# Patient Record
Sex: Female | Born: 1975 | Race: White | Hispanic: No | Marital: Married | Smoking: Former smoker
Health system: Southern US, Community
[De-identification: ages and names within clinical notes are randomized; demographics above are authoritative.]

## PROBLEM LIST (undated history)

## (undated) DIAGNOSIS — F319 Bipolar disorder, unspecified: Secondary | ICD-10-CM

## (undated) DIAGNOSIS — F419 Anxiety disorder, unspecified: Secondary | ICD-10-CM

## (undated) DIAGNOSIS — F32A Depression, unspecified: Secondary | ICD-10-CM

## (undated) DIAGNOSIS — M797 Fibromyalgia: Secondary | ICD-10-CM

## (undated) DIAGNOSIS — F4310 Post-traumatic stress disorder, unspecified: Secondary | ICD-10-CM

## (undated) DIAGNOSIS — E119 Type 2 diabetes mellitus without complications: Secondary | ICD-10-CM

## (undated) DIAGNOSIS — D649 Anemia, unspecified: Secondary | ICD-10-CM

## (undated) DIAGNOSIS — Z973 Presence of spectacles and contact lenses: Secondary | ICD-10-CM

## (undated) DIAGNOSIS — R7303 Prediabetes: Secondary | ICD-10-CM

## (undated) DIAGNOSIS — E66813 Obesity, class 3: Secondary | ICD-10-CM

## (undated) DIAGNOSIS — M1990 Unspecified osteoarthritis, unspecified site: Secondary | ICD-10-CM

## (undated) DIAGNOSIS — F329 Major depressive disorder, single episode, unspecified: Secondary | ICD-10-CM

## (undated) DIAGNOSIS — Z9289 Personal history of other medical treatment: Secondary | ICD-10-CM

## (undated) HISTORY — DX: Morbid (severe) obesity due to excess calories: E66.01

## (undated) HISTORY — DX: Depression, unspecified: F32.A

## (undated) HISTORY — DX: Anxiety disorder, unspecified: F41.9

## (undated) HISTORY — DX: Post-traumatic stress disorder, unspecified: F43.10

## (undated) HISTORY — DX: Fibromyalgia: M79.7

## (undated) HISTORY — DX: Bipolar disorder, unspecified: F31.9

## (undated) HISTORY — DX: Type 2 diabetes mellitus without complications: E11.9

## (undated) HISTORY — DX: Obesity, class 3: E66.813

## (undated) HISTORY — DX: Anemia, unspecified: D64.9

## (undated) HISTORY — DX: Prediabetes: R73.03

## (undated) HISTORY — DX: Major depressive disorder, single episode, unspecified: F32.9

---

## 2004-07-27 HISTORY — PX: CHOLECYSTECTOMY: SHX55

## 2004-12-12 ENCOUNTER — Observation Stay (HOSPITAL_COMMUNITY): Admission: EM | Admit: 2004-12-12 | Discharge: 2004-12-13 | Payer: Self-pay | Admitting: General Surgery

## 2005-11-01 IMAGING — RF DG CHOLANGIOGRAM OPERATIVE
1 series · 4 of 4 positions shown · non-contrast
Comparison: none

CLINICAL DATA: Cholelithiasis. 
 INTRAOPERATIVE CHOLANGIOGRAM:
 The common bile duct is normal in caliber.  There are no filling defects or strictures and there is spillage of contrast into the duodenum.  The intrahepatic biliary radicles have a normal appearance.

[Series 1: run · 4 of 65 frames shown]
[frame 10/65]
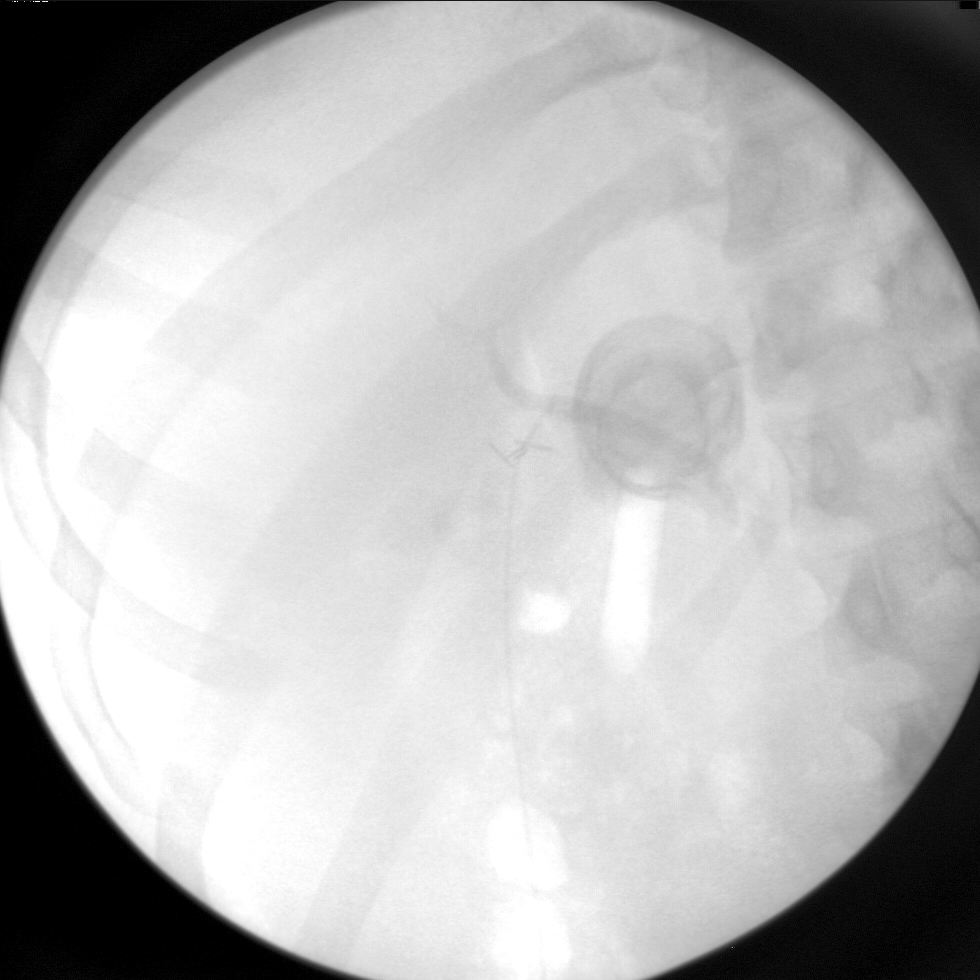
[frame 33/65]
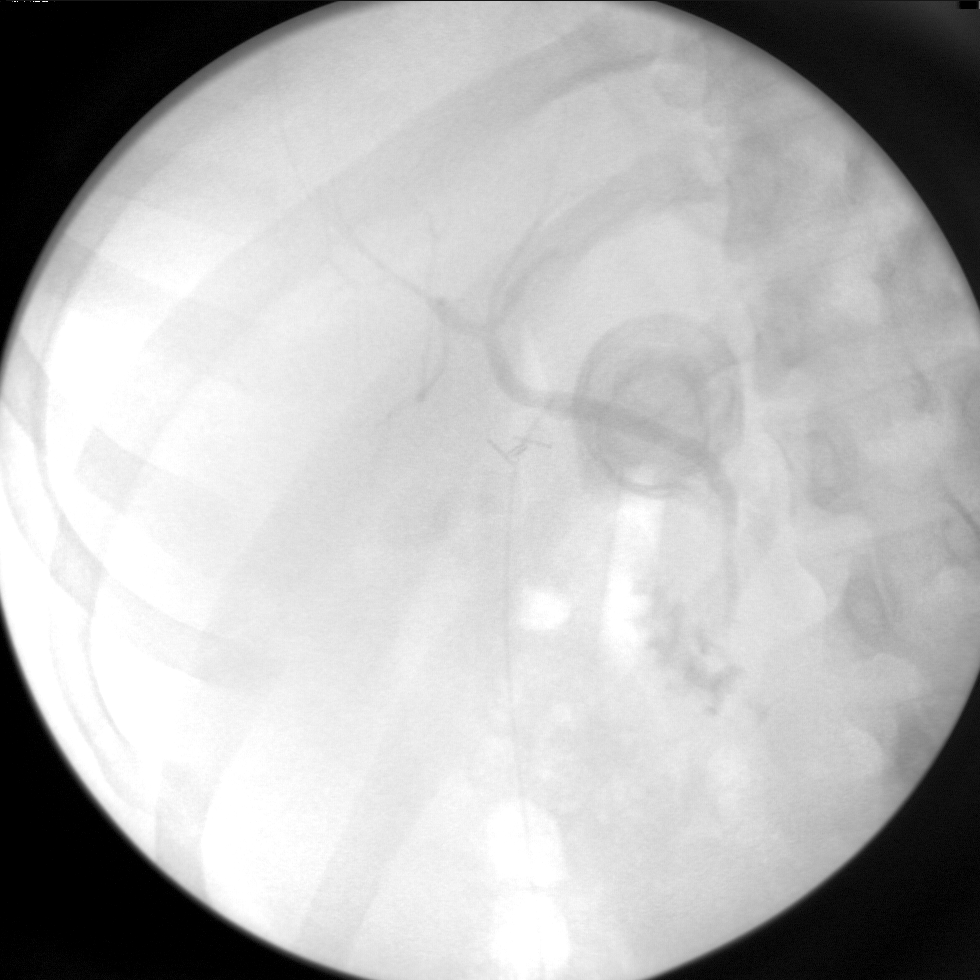
[frame 56/65]
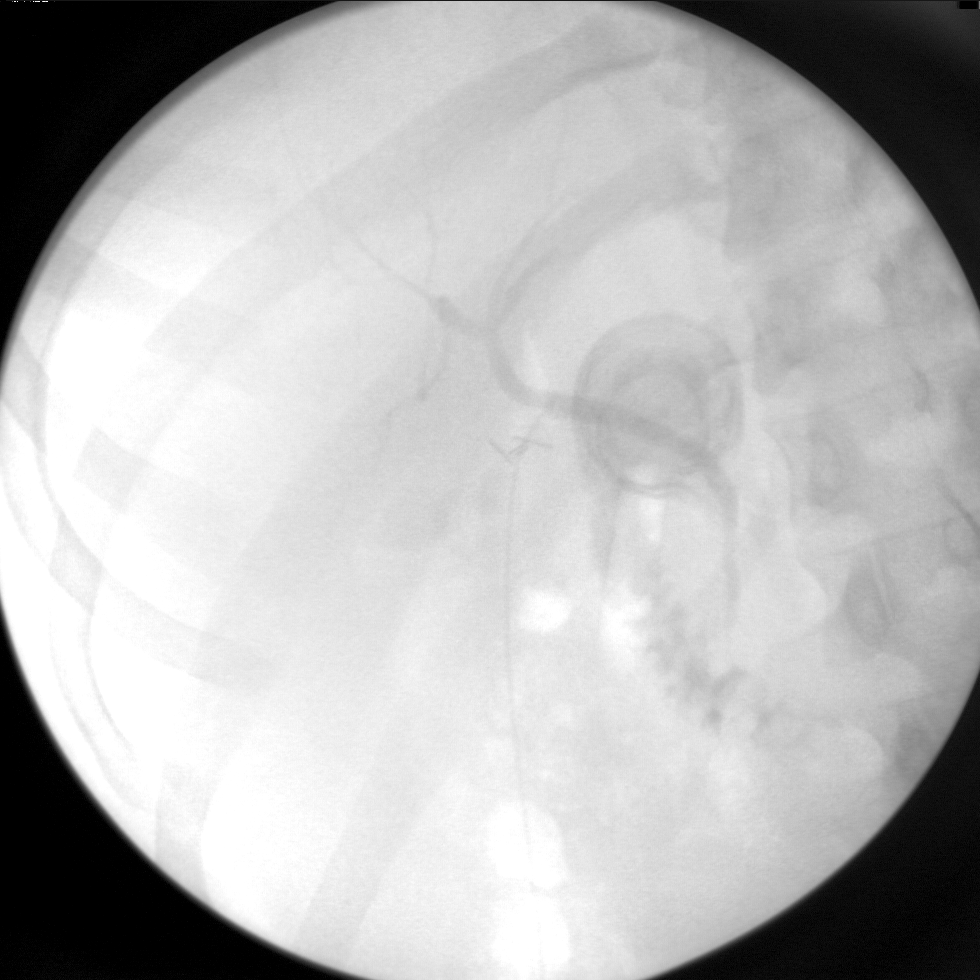
[frame 62/65]
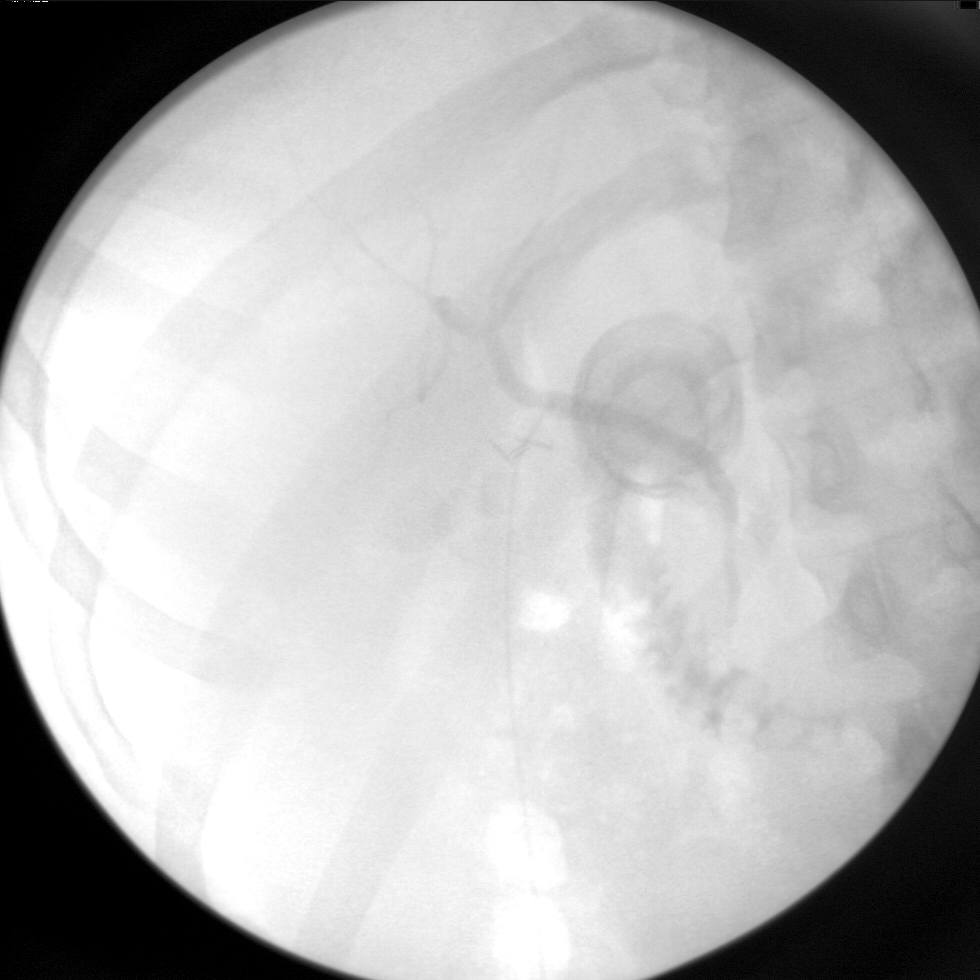

[4 of 4 positions shown; findings below may reference images not displayed]

IMPRESSION: Normal study.

## 2016-03-03 ENCOUNTER — Other Ambulatory Visit: Payer: Self-pay | Admitting: Orthopedic Surgery

## 2018-04-19 ENCOUNTER — Encounter: Payer: Self-pay | Admitting: Neurology

## 2018-04-21 ENCOUNTER — Ambulatory Visit (INDEPENDENT_AMBULATORY_CARE_PROVIDER_SITE_OTHER): Payer: Medicare Other | Admitting: Neurology

## 2018-04-21 ENCOUNTER — Encounter: Payer: Self-pay | Admitting: Neurology

## 2018-04-21 VITALS — BP 152/86 | HR 93 | Ht 65.0 in | Wt 325.0 lb

## 2018-04-21 DIAGNOSIS — G4730 Sleep apnea, unspecified: Secondary | ICD-10-CM

## 2018-04-21 DIAGNOSIS — G4711 Idiopathic hypersomnia with long sleep time: Secondary | ICD-10-CM | POA: Diagnosis not present

## 2018-04-21 DIAGNOSIS — R0683 Snoring: Secondary | ICD-10-CM | POA: Insufficient documentation

## 2018-04-21 DIAGNOSIS — R5383 Other fatigue: Secondary | ICD-10-CM

## 2018-04-21 DIAGNOSIS — F4310 Post-traumatic stress disorder, unspecified: Secondary | ICD-10-CM | POA: Insufficient documentation

## 2018-04-21 DIAGNOSIS — F329 Major depressive disorder, single episode, unspecified: Secondary | ICD-10-CM

## 2018-04-21 DIAGNOSIS — G4710 Hypersomnia, unspecified: Secondary | ICD-10-CM | POA: Diagnosis not present

## 2018-04-21 DIAGNOSIS — F32A Depression, unspecified: Secondary | ICD-10-CM

## 2018-04-21 NOTE — Progress Notes (Signed)
SLEEP MEDICINE CLINIC   Provider:  Larey Seat, M.D.  Primary Care Physician:  Everardo Beals, NP Referring Provider: Garnetta Buddy, NP  - [facility].    Chief Complaint  Patient presents with  . New Patient (Initial Visit)    pt alone, rm 11. pt here to "have a sleep study completed to assess if she has sleep apnea"pt states she sleeps long and she is not rested when waking up. was recently told he had apnea and also snores. pt states that she had a sleep study over 10 years at  Dr Carren Rang lab on [facility] [address]. -she didnt achieve REM sleep but at the time no apnea was found.    HPI:  Megan Haas is a 42 y.o. female patient seen on 04-21-2018  in a referral from NP Cox for a new evaluation of sleep.   Chief complaint according to patient : "I sleep a lot and do not work up rested".   She snores and recently learnt that she pauses in her breathing while sharing her bedroom at a retreat. She has carried a diagnosis of Fibromyalgia since age 26 and became morbidly obese while treated for depression/ anxiety.   Sleep habits are as follows: no regular dinner time, anytime between 4 and 9 PM- work related. Sometimes she is getting  hungry only later in the evening.  Bedtime is not later than 11 PM, after watching TV or sawing. The bedroom is dark with light blocking windows, cool and quiet.  Usually, she is asleep fast, resting on on multiple pillows in supine position or on her right side. Has 2 times nocturia each night. She snores and  has recently learnt that she is apnoeic .  She can sleep up to 13 hours, averages 10 hours. She is not rested and drinks a lot of coffee. This has been going on for decades. She struggles not to fall asleep in daytime, but naps and is not planning to sleep.    Sleep medical history and family sleep history: father has sleep apnea.    Social history: Lives with a room mate, no pets. Non smoker, no drinker, caffeine  in form of coffee and tea several cups in AM. Sometimes in PM . Rarely drinks soda .  She is a delivery driver for a store.  No history of night shift work.   Review of Systems: Out of a complete 14 system review, the patient complains of only the following symptoms, and all other reviewed systems are negative.  Hypersomnia, long sleep time, no dreams intrusion.  On Vyvanse, Wellbutrin, Prazosin for PTSD - if not on Prazosin will have very vivid dreams, not flash backs. Marland Kitchen   Epworth score 14/ 24  , Fatigue severity score 60/63 points  , depression score N/A   Social History   Socioeconomic History  . Marital status: Single    Spouse name: Not on file  . Number of children: Not on file  . Years of education: Not on file  . Highest education level: Not on file  Occupational History  . Not on file  Social Needs  . Financial resource strain: Not on file  . Food insecurity:    Worry: Not on file    Inability: Not on file  . Transportation needs:    Medical: Not on file    Non-medical: Not on file  Tobacco Use  . Smoking status: Former Smoker    Last attempt  to quit: 2004    Years since quitting: 15.7  . Smokeless tobacco: Never Used  Substance and Sexual Activity  . Alcohol use: Not Currently    Comment: 1991 quit  . Drug use: Not Currently    Comment: quit 1991  . Sexual activity: Not on file  Lifestyle  . Physical activity:    Days per week: Not on file    Minutes per session: Not on file  . Stress: Not on file  Relationships  . Social connections:    Talks on phone: Not on file    Gets together: Not on file    Attends religious service: Not on file    Active member of club or organization: Not on file    Attends meetings of clubs or organizations: Not on file    Relationship status: Not on file  . Intimate partner violence:    Fear of current or ex partner: Not on file    Emotionally abused: Not on file    Physically abused: Not on file    Forced sexual activity:  Not on file  Other Topics Concern  . Not on file  Social History Narrative  . Not on file    Family History  Problem Relation Age of Onset  . Diabetes Father   . Hypertension Maternal Aunt   . Diabetes Maternal Uncle   . Hypertension Maternal Grandmother   . Diabetes Paternal Grandmother     Past Medical History:  Diagnosis Date  . Anxiety   . Depression   . Fibromyalgia   . Pre-diabetes      Current Outpatient Medications  Medication Sig Dispense Refill  . ALPRAZolam (XANAX) 0.5 MG tablet Take 0.5-1 mg by mouth daily.  1  . ARIPiprazole (ABILIFY) 10 MG tablet Take 10 mg by mouth daily.    Marland Kitchen buPROPion (WELLBUTRIN XL) 300 MG 24 hr tablet Take 300 mg by mouth daily.  1  . gabapentin (NEURONTIN) 400 MG capsule Take 400 mg by mouth 4 (four) times daily.    Marland Kitchen lisdexamfetamine (VYVANSE) 70 MG capsule Take 70 mg by mouth daily.    . prazosin (MINIPRESS) 2 MG capsule Take 2 mg by mouth at bedtime.    Marland Kitchen VIIBRYD 40 MG TABS Take 40 mg by mouth daily.  1   No current facility-administered medications for this visit.     Allergies as of 04/21/2018  . (Not on File)    Vitals: BP (!) 152/86   Pulse 93   Ht 5\' 5"  (1.651 m)   Wt (!) 325 lb (147.4 kg)   BMI 54.08 kg/m  Last Weight:  Wt Readings from Last 1 Encounters:  04/21/18 (!) 325 lb (147.4 kg)   YTK:ZSWF mass index is 54.08 kg/m.     Last Height:   Ht Readings from Last 1 Encounters:  04/21/18 5\' 5"  (1.651 m)    Physical exam:  General: The patient is awake, alert and appears not in acute distress. The patient is well groomed. Head: Normocephalic, atraumatic. Neck is supple. Mallampati 5,  neck circumference:19.5. Nasal airflow patent , Prognathia is seen.  Cardiovascular:  Regular rate and rhythm , without  murmurs or carotid bruit, and without distended neck veins. Respiratory: Lungs are clear to auscultation. Skin:  Without evidence of edema, or rash Trunk: BMI is 54.8. The patient's posture is stooped.    Neurologic exam : The patient is awake and alert, oriented to place and time.    Attention span & concentration  ability appears normal.  Speech is fluent,  without dysarthria, dysphonia or aphasia.  Mood and affect are appropriate.  Cranial nerves: Pupils are equal and briskly reactive to light. Funduscopic exam deferred -. Extraocular movements  in vertical and horizontal planes intact and without nystagmus. Visual fields by finger perimetry are intact. Hearing to finger rub intact.  Facial sensation intact to fine touch. Facial motor strength is symmetric and tongue and uvula move midline. Shoulder shrug was symmetrical.   Motor exam:   Normal tone, muscle bulk and symmetric strength in all extremities. Sensory:  Fine touch, pinprick and vibration were tested in all extremities. Proprioception tested in the upper extremities was normal. Coordination: Rapid alternating movements in the fingers/hands was normal. Finger-to-nose maneuver  normal without evidence of ataxia, dysmetria or tremor. Gait and station: Patient walks without assistive device and is able unassisted to climb up to the exam table. Strength within normal limits.  Stance is wider based - stable .   Turns with 3 Steps. Romberg testing is negative.  Deep tendon reflexes: in the  upper and lower extremities are symmetric and intact. Babinski maneuver response is  downgoing.  Reviewed [facility] visit notes by NP Cox.   Assessment:  After physical and neurologic examination, review of laboratory studies,  Personal review of imaging studies, reports of other /same  Imaging studies, results of polysomnography and / or neurophysiology testing and pre-existing records as far as provided in visit., my assessment is   1) I have the pleasure of meeting Megan Haas today, a 42 year old Caucasian left-handed female patient who is new to our practice. The patient has a almost lifelong history of hypersomnia, craving sleep, having  an irresistible urge to go to sleep and daytime but also reports vivid dreams at night.  She has been treated for PTSD and she is using an antidepressant, Wellbutrin, Abilify, she does have PRN use of the benzodiazepine for sleep initiation if needed or for anxiety, and she uses prazosin.  Problems and has reduced the nighttime vivid and lucid dreams.  The patient recalls that prior to her traumatic events that set off the PTSD she was not as daytime sleepy, she was not as often in pain or discomfort and she was more productive and focused during the day.  We will have to concentrate on organic sleep disorders and in her case she has several red flags for the presence of obstructive sleep apnea, she is prediabetic she has a body mass index that approaches 50, she does have hypertension here today in the office as well as in the previous office visit with nurse practitioner Cox.  She has been told that she snores and recently a roommate has mentioned that she has apnea.  But I think that obstructive sleep apnea is identified with be needed to be treated it may not be the only factor contributing to the hypersomnia.--there are non-organic factors there.   2) BMI- patient may benefit from medical weight management.      The patient was advised of the nature of the diagnosed disorder , the treatment options and the  risks for general health and wellness arising from not treating the condition.   I spent more than 50 minutes of face to face time with the patient.  Greater than 50% of time was spent in counseling and coordination of care. We have discussed the diagnosis and differential and I answered the patient's questions.    Plan:  Treatment plan and additional workup :  Attended sleep study SPLIT night,  High degree of fatigue and sleepiness.  PTSD. May benefit from expanded EEG montage.    Larey Seat, MD 03/26/7459, 02:98 PM  Certified in Neurology by ABPN Certified in [place] by  [facility] [address] [place],  [address]

## 2018-04-29 ENCOUNTER — Ambulatory Visit (INDEPENDENT_AMBULATORY_CARE_PROVIDER_SITE_OTHER): Payer: Medicare Other | Admitting: Neurology

## 2018-04-29 DIAGNOSIS — R5383 Other fatigue: Secondary | ICD-10-CM

## 2018-04-29 DIAGNOSIS — G4711 Idiopathic hypersomnia with long sleep time: Secondary | ICD-10-CM

## 2018-04-29 DIAGNOSIS — F4310 Post-traumatic stress disorder, unspecified: Secondary | ICD-10-CM

## 2018-04-29 DIAGNOSIS — G4710 Hypersomnia, unspecified: Secondary | ICD-10-CM

## 2018-04-29 DIAGNOSIS — F329 Major depressive disorder, single episode, unspecified: Secondary | ICD-10-CM

## 2018-04-29 DIAGNOSIS — G4730 Sleep apnea, unspecified: Secondary | ICD-10-CM

## 2018-04-29 DIAGNOSIS — R0683 Snoring: Secondary | ICD-10-CM

## 2018-05-03 NOTE — Procedures (Signed)
PATIENT'S NAME:  Megan Haas, Megan Haas DOB:      04-Jul-1976      MR#:    [id number]     DATE OF RECORDING: 04/29/2018 REFERRING M.D.:  Lorelle Gibbs, NP Study Performed:   Baseline Polysomnogram HISTORY:  Megan Haas is a 42 y.o. female patient with a main complaint: "I sleep a lot and do not wake up rested".   She was told that she snores and recently learnt that she pauses in her breathing while sharing her bedroom at a retreat. She has felt fatigued- excessively, and carried a diagnosis of Fibromyalgia since age 63 .She became morbidly obese while treated for depression/ anxiety.  The patient endorsed the Epworth Sleepiness Scale at 14/24 points, the FSS at 60/63 points.   The patient's weight 325 pounds with a height of 65 (inches), resulting in a BMI of 54.0 kg/m2. The patient's neck circumference measured 19.5 inches.  CURRENT MEDICATIONS: Xanax, Abilify, Wellbutrin, Neurontin, Vyvanse, Minipress, Viibryd   PROCEDURE:  This is a multichannel digital polysomnogram utilizing the Somnostar 11.2 system.  Electrodes and sensors were applied and monitored per AASM Specifications.   EEG, EOG, Chin and Limb EMG, were sampled at 200 Hz.  ECG, Snore and Nasal Pressure, Thermal Airflow, Respiratory Effort, CPAP Flow and Pressure, Oximetry was sampled at 50 Hz. Digital video and audio were recorded.      BASELINE STUDY Lights Out was at 22:01 and Lights On at 05:00.  Total recording time (TRT) was 419 minutes, with a total sleep time (TST) of 348.5 minutes.  The patient's sleep latency was 64 minutes.  REM latency was 0 minutes. The sleep efficiency was 83.2 %.     SLEEP ARCHITECTURE: WASO (Wake after sleep onset) was 49.5 minutes. There were 23 minutes in Stage N1, 284 minutes Stage N2, 41.5 minutes Stage N3 and no minutes in Stage REM.  The percentage of Stage N1 was 6.6%, Stage N2 was 81.5%, Stage N3 was 11.9% and Stage R (REM sleep) was 0%.   RESPIRATORY ANALYSIS:  There were a total of 25  respiratory events:  8 obstructive apneas, 0 central apneas and 17 hypopneas with isolated respiratory event related arousals (RERAs).   The total APNEA/HYPOPNEA INDEX (AHI) was 4.3 /hour and the total RESPIRATORY DISTURBANCE INDEX was 5.3 /hour. All 34 events in NREM with a non-REM AHI of 4.3.  The patient spent 143.5 minutes of total sleep time in the supine position and 205 minutes in non-supine. The supine AHI was 3.8 versus a non-supine AHI of 4.7.  OXYGEN SATURATION & C02:  The Wake baseline 02 saturation was 93%, with the lowest being 88%. Time spent below 89% saturation equaled 0 minutes.   PERIODIC LIMB MOVEMENTS:  The patient had a total of 0 Periodic Limb Movements.  The arousals were noted as: 43 were spontaneous, 0 were associated with PLMs, and 3 were associated with respiratory events.     Audio and video analysis did not show any abnormal or unusual movements, behaviors, phonations or vocalizations.   The patient took bathroom breaks. Snoring was noted. EKG was in keeping with normal sinus rhythm (NSR). Post-study, the patient indicated that sleep was the same as usual- unrefreshed, unrestored and too brief?    IMPRESSION: Sleep is fragmented by spontaneous arousals. These can be related to dreams, pain or external stimuli. There was neither enough apnea nor PLMs or hypoxemia found.   1. Borderline Obstructive Sleep Apnea (OSA) - the AHI was less than 5/h and  would not qualify for PAP therapy.  2. Primary Snoring. There is evidence of UARS and this may respond to a dental device and weight loss. 3. Sleep was void of REM sleep. No manifestations of PTSD as in dream enactment.    RECOMMENDATIONS: 1) I recommend to give a dental device a trial if the patient can tolerate mandibular advancement therapy-  not an option if TMJ is present. 2) Medical weight management is recommended.   I certify that I have reviewed the entire raw data recording prior to the issuance of this  report in accordance with the Standards of Accreditation of the American Academy of Sleep Medicine (AASM)  Larey Seat, MD     05-03-2018  Diplomat, American Board of Psychiatry and Neurology  Diplomat, American Board of [place] Director, [facility] [employer]

## 2018-05-04 ENCOUNTER — Telehealth: Payer: Self-pay | Admitting: Neurology

## 2018-05-04 NOTE — Telephone Encounter (Signed)
-----   Message from Larey Seat, MD sent at 05/03/2018  5:54 PM EDT ----- Not enough apnea to recommend intervention , but snoring and RERAS - this constitutes UARS and can be improved by dental device. - only if not a TMJ-pain patient. Please ask.  No hypoxemia. No REM sleep.

## 2018-05-04 NOTE — Telephone Encounter (Signed)
I called pt. I advised pt that Dr. Brett Fairy reviewed their sleep study results and found that pt did not have any evidence of sleep apnea,hypoxemia, abnormal movements. Advised her that she stayed in NSR. Informed her that Dr Brett Fairy did note she had primary snoring indicating primary upper airway restriction. Advised this could be treated by a dental device as long as she doesn't have any complications or history of TMJ. The pt asked if she ever went into REM sleep. Advised her that Dr Brett Fairy noted that she didn't hit REM sleep. Patient was concerned about not going into that stage of sleep. Advised that certain medications that she is on can hinder the patient entering the REM stage. Ensured her there is nothing  Necessarily wrong with not going in that stage except missing out on the deep sleep. Pt verbalized understanding. Patient requested a copy of sleep study. Informed her I would place this in the mail. Advised her that if snoring is bothersome and she would like to pursue a dental device to let us know and we can certainly place a dental referral. Pt verbalized understanding. Pt had no questions at this time but was encouraged to call back if questions arise.

## 2018-06-30 DIAGNOSIS — E119 Type 2 diabetes mellitus without complications: Secondary | ICD-10-CM | POA: Insufficient documentation

## 2018-07-11 DIAGNOSIS — F319 Bipolar disorder, unspecified: Secondary | ICD-10-CM | POA: Insufficient documentation

## 2018-08-02 ENCOUNTER — Encounter: Payer: Medicare Other | Attending: *Deleted | Admitting: Dietician

## 2018-08-02 ENCOUNTER — Encounter: Payer: Self-pay | Admitting: Dietician

## 2018-08-02 DIAGNOSIS — E119 Type 2 diabetes mellitus without complications: Secondary | ICD-10-CM

## 2018-08-02 NOTE — Progress Notes (Signed)
Patient was seen on 08/02/17  for the first of a series of three diabetes self-management courses at [facility].  Patient Education Plan per assessed needs and concerns is to attend three course education program for Diabetes Self Management Education.  The following learning objectives were met by the patient during this class:  Describe diabetes  State some common risk factors for diabetes  Defines the role of glucose and insulin  Identifies type of diabetes and pathophysiology  Describe the relationship between diabetes and cardiovascular risk  State the members of the Healthcare Team  States the rationale for glucose monitoring  State when to test glucose  State their individual Target Range  State the importance of logging glucose readings  Describe how to interpret glucose readings  Identifies A1C target  Explain the correlation between A1c and eAG values  State symptoms and treatment of high blood glucose  State symptoms and treatment of low blood glucose  Explain proper technique for glucose testing  Identifies proper sharps disposal  Handouts given during class include:  ADA Diabetes You Take Control   Carb Counting and Meal Planning book  Meal Plan Card  Meal planning worksheet  Low Sodium Flavoring Tips  Types of Fats  The diabetes portion plate  F4E to eAG Conversion Chart  Diabetes Recommended Care Schedule  Support Group  Diabetes Success Plan  Core Class Satisfaction Survey   Follow-Up Plan:  Attend core 2

## 2018-08-09 ENCOUNTER — Encounter: Payer: Medicare Other | Admitting: Dietician

## 2018-08-09 ENCOUNTER — Encounter: Payer: Self-pay | Admitting: Dietician

## 2018-08-09 DIAGNOSIS — E119 Type 2 diabetes mellitus without complications: Secondary | ICD-10-CM | POA: Diagnosis not present

## 2018-08-09 NOTE — Progress Notes (Signed)
Patient was seen on 08/09/2018 for the second of a series of three diabetes self-management courses at the [facility]. The following learning objectives were met by the patient during this class:   Describe the role of different macronutrients on glucose  Explain how carbohydrates affect blood glucose  State what foods contain the most carbohydrates  Demonstrate carbohydrate counting  Demonstrate how to read Nutrition Facts food label  Describe effects of various fats on heart health  Describe the importance of good nutrition for health and healthy eating strategies  Describe techniques for managing your shopping, cooking and meal planning  List strategies to follow meal plan when dining out  Describe the effects of alcohol on glucose and how to use it safely  Goals:  Follow Diabetes Meal Plan as instructed  Aim to spread carbs evenly throughout the day  Aim for 3 meals per day and snacks as needed Include lean protein foods to meals/snacks  Monitor glucose levels as instructed by your doctor   Follow-Up Plan:  Attend Core 3  Work towards following your personal food plan.   

## 2018-08-16 ENCOUNTER — Ambulatory Visit: Payer: Self-pay

## 2018-08-18 HISTORY — PX: ESOPHAGOGASTRODUODENOSCOPY: SHX1529

## 2019-01-25 HISTORY — PX: GASTROPLASTY DUODENAL SWITCH: SHX1699

## 2019-03-06 DIAGNOSIS — Z9884 Bariatric surgery status: Secondary | ICD-10-CM | POA: Insufficient documentation

## 2019-10-20 ENCOUNTER — Ambulatory Visit: Payer: Medicare Other | Attending: Internal Medicine

## 2019-10-20 DIAGNOSIS — Z23 Encounter for immunization: Secondary | ICD-10-CM

## 2019-10-20 NOTE — Progress Notes (Signed)
   Covid-19 Vaccination Clinic  Name:  Megan Haas    MRN: [id number] DOB: 1976-01-01  10/20/2019  Ms. Megan Haas was observed post Covid-19 immunization for 15 minutes without incident. She was provided with Vaccine Information Sheet and instruction to access the V-Safe system.   Ms. Megan Haas was instructed to call 911 with any severe reactions post vaccine: Marland Kitchen Difficulty breathing  . Swelling of face and throat  . A fast heartbeat  . A bad rash all over body  . Dizziness and weakness   Immunizations Administered    Name Date Dose VIS Date Route   Pfizer COVID-19 Vaccine 10/20/2019  1:35 PM 0.3 mL 07/07/2019 Intramuscular   Manufacturer: [place]   Lot: [id number]   [place]: [id number]

## 2019-11-14 ENCOUNTER — Ambulatory Visit: Payer: Medicare Other | Attending: Internal Medicine

## 2019-11-14 DIAGNOSIS — Z23 Encounter for immunization: Secondary | ICD-10-CM

## 2019-11-14 NOTE — Progress Notes (Signed)
   Covid-19 Vaccination Clinic  Name:  Megan Haas    MRN: [id number] DOB: [birth year]  11/14/2019  [name] was observed post Covid-19 immunization for 15 minutes without incident. She was provided with Vaccine Information Sheet and instruction to access the V-Safe system.   [name] was instructed to call 911 with any severe reactions post vaccine: Marland Kitchen Difficulty breathing  . Swelling of face and throat  . A fast heartbeat  . A bad rash all over body  . Dizziness and weakness   Immunizations Administered    Name Date Dose VIS Date Route   Pfizer COVID-19 Vaccine 11/14/2019  4:46 PM 0.3 mL 09/20/2018 Intramuscular   Manufacturer: [place]   Lot: [id number]   [place]: [id number]

## 2020-02-07 ENCOUNTER — Encounter: Payer: Self-pay | Admitting: Physician Assistant

## 2020-03-14 ENCOUNTER — Encounter: Payer: Self-pay | Admitting: Physician Assistant

## 2020-03-14 ENCOUNTER — Ambulatory Visit (INDEPENDENT_AMBULATORY_CARE_PROVIDER_SITE_OTHER): Payer: Medicare Other | Admitting: Physician Assistant

## 2020-03-14 ENCOUNTER — Other Ambulatory Visit (INDEPENDENT_AMBULATORY_CARE_PROVIDER_SITE_OTHER): Payer: Medicare Other

## 2020-03-14 VITALS — BP 120/82 | HR 69 | Ht 65.0 in | Wt 274.2 lb

## 2020-03-14 DIAGNOSIS — R7989 Other specified abnormal findings of blood chemistry: Secondary | ICD-10-CM

## 2020-03-14 LAB — HEPATIC FUNCTION PANEL
ALT: 29 U/L (ref 0–35)
AST: 18 U/L (ref 0–37)
Albumin: 3.8 g/dL (ref 3.5–5.2)
Alkaline Phosphatase: 93 U/L (ref 39–117)
Bilirubin, Direct: 0 mg/dL (ref 0.0–0.3)
Total Bilirubin: 0.3 mg/dL (ref 0.2–1.2)
Total Protein: 7.3 g/dL (ref 6.0–8.3)

## 2020-03-14 NOTE — Progress Notes (Signed)
Subjective:    Patient ID: Megan Haas, female    DOB: 1976-05-01, 44 y.o.   MRN: [id number]  HPI Megan Haas is a pleasant 44 year old white female, new to GI today referred by Yisroel Ramming, NP for elevated liver function studies. 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  She says she has lost 60 pounds over the past year and is still losing though weight loss has slowed. She is status post remote cholecystectomy, has history of PTSD and depression.  She has prior diagnosis of iron deficiency anemia.  She is being followed and managed for that by the bariatric clinic.  She is on iron supplementation. Recent labs done June 2021 showed WBC of 11.7 hemoglobin 10.1 hematocrit of 31.4 platelets 463 LFTs with AST 37 ALT of 76.  Review of care everywhere shows LFTs done 02/23/2020 normal with T bili 0.4/alk phos 80/AST 28/ALT 40. Patient has no prior history of liver disease that she is aware of, had not been told of elevated LFTs in the past.  Family history is negative for liver disease as far she is aware. No regular alcohol use, no prior history of hepatitis. She had recently started Ozempic.  She has been on Rexulti over the past several months.  No other new medications. Other medical issues include bipolar disorder and fibromyalgia.  Review of Systems Pertinent positive and negative review of systems were noted in the above HPI section.  All other review of systems was otherwise negative.  Outpatient Encounter Medications as of 03/14/2020  Medication Sig  . brexpiprazole (REXULTI) 2 MG TABS tablet Take by mouth daily.  Marland Kitchen buPROPion (WELLBUTRIN XL) 300 MG 24 hr tablet Take 300 mg by mouth daily.  . Calcium Carbonate-Vit D-Min (CALCIUM 1200 PO) Take by mouth.  . dexmethylphenidate (FOCALIN) 10 MG tablet Take 10 mg by mouth 2 (two) times daily.  Marland Kitchen gabapentin (NEURONTIN) 400 MG  capsule Take 400 mg by mouth 4 (four) times daily.  . Iron-Vitamin C (IRON 100/C) 100-250 MG TABS Take by mouth.  . Multiple Vitamins-Minerals (MULTIVITAMIN WITH MINERALS) tablet Take 1 tablet by mouth daily.  . Semaglutide,0.25 or 0.5MG/DOS, (OZEMPIC, 0.25 OR 0.5 MG/DOSE,) 2 MG/1.5ML SOPN Inject into the skin.  Marland Kitchen VIIBRYD 40 MG TABS Take 40 mg by mouth daily.  . [DISCONTINUED] ALPRAZolam (XANAX) 0.5 MG tablet Take 0.5-1 mg by mouth daily.  . [DISCONTINUED] ARIPiprazole (ABILIFY) 10 MG tablet Take 10 mg by mouth daily.  . [DISCONTINUED] lisdexamfetamine (VYVANSE) 70 MG capsule Take 70 mg by mouth daily.  . [DISCONTINUED] prazosin (MINIPRESS) 2 MG capsule Take 2 mg by mouth at bedtime.  . [DISCONTINUED] temazepam (RESTORIL) 15 MG capsule Take 15 mg by mouth at bedtime as needed for sleep.   No facility-administered encounter medications on file as of 03/14/2020.   Not on File Patient Active Problem List   Diagnosis Date Noted  . Hypersomnia with long sleep time, idiopathic 04/21/2018  . Fatigue due to depression 04/21/2018  . Snoring 04/21/2018  . Post traumatic stress disorder (PTSD) 04/21/2018   Social History   Socioeconomic History  . Marital status: Single    Spouse name: Not on file  . Number of children: Not on file  . Years of education: Not on file  . Highest education level: Not on file  Occupational History  . Not on file  Tobacco Use  . Smoking status: Former Smoker  Quit date: 2004    Years since quitting: 17.6  . Smokeless tobacco: Never Used  Vaping Use  . Vaping Use: Never used  Substance and Sexual Activity  . Alcohol use: Not Currently    Comment: 1991 quit  . Drug use: Not Currently    Comment: quit 1991  . Sexual activity: Not on file  Other Topics Concern  . Not on file  Social History Narrative  . Not on file   Social Determinants of Health   Financial Resource Strain:   . Difficulty of Paying Living Expenses: Not on file  Food Insecurity:     . Worried About Charity fundraiser in the Last Year: Not on file  . Ran Out of Food in the Last Year: Not on file  Transportation Needs:   . Lack of Transportation (Medical): Not on file  . Lack of Transportation (Non-Medical): Not on file  Physical Activity:   . Days of Exercise per Week: Not on file  . Minutes of Exercise per Session: Not on file  Stress:   . Feeling of Stress : Not on file  Social Connections:   . Frequency of Communication with Friends and Family: Not on file  . Frequency of Social Gatherings with Friends and Family: Not on file  . Attends Religious Services: Not on file  . Active Member of Clubs or Organizations: Not on file  . Attends Archivist Meetings: Not on file  . Marital Status: Not on file  Intimate Partner Violence:   . Fear of Current or Ex-Partner: Not on file  . Emotionally Abused: Not on file  . Physically Abused: Not on file  . Sexually Abused: Not on file    Megan Haas's family history includes Diabetes in her father, maternal uncle, and paternal grandmother; Hypertension in her maternal aunt and maternal grandmother.      Objective:    Vitals:   03/14/20 0927  BP: 120/82  Pulse: 69    Physical Exam Well-developed well-nourished female in no acute distress.  Height, Weight 274 , BMI 45.6  HEENT; nontraumatic normocephalic, EOMI, PER R LA, sclera anicteric. Oropharynx;not done Neck; supple, no JVD Cardiovascular; regular rate and rhythm with S1-S2, no murmur rub or gallop Pulmonary; Clear bilaterally Abdomen; soft, obese, nontender, nondistended, no palpable mass or hepatosplenomegaly, bowel sounds are active Rectal; not done today Skin; benign exam, no jaundice rash or appreciable lesions Extremities; no clubbing cyanosis or edema skin warm and dry Neuro/Psych; alert and oriented x4, grossly nonfocal mood and affect appropriate       Assessment & Plan:   #35 44 year old white female with recent mild  transaminitis.  She actually had labs checked 3 weeks ago at [facility] and LFTs were normal. Etiology not clear.  Patient is status post cholecystectomy.  She has history of morbid obesity and is status post biliopancreatic diversion and duodenal switch done July 2020 at [facility]. Suspect fatty liver/[place], rule out medication induced.  #2.  Status post remote cholecystectomy 2006 #3.  Bipolar disorder PTSD depression #4.  Fibromyalgia #5  history of chronic anemia  Plan repeat hepatic panel today Check upper abdominal ultrasound. Further recommendations pending labs and ultrasound. Patient will be established with Dr. Rush Landmark.   Brooklyne Radke S Krystall Kruckenberg PA-C 03/14/2020   Cc: Everardo Beals, NP

## 2020-03-14 NOTE — Patient Instructions (Addendum)
If you are age 46 or older, your body mass index should be between 23-30. Your Body mass index is 45.64 kg/m. If this is out of the aforementioned range listed, please consider follow up with your Primary Care Provider.  If you are age 53 or younger, your body mass index should be between 19-25. Your Body mass index is 45.64 kg/m. If this is out of the aformentioned range listed, please consider follow up with your Primary Care Provider.   Your provider has requested that you go to the basement level for lab work before leaving today. Press "B" on the elevator. The lab is located at the first door on the left as you exit the elevator.  You have been scheduled for an abdominal ultrasound at [facility] on 03/19/20 at 9:00 am. Please arrive 15 minutes prior to your appointment for registration. Make certain not to have anything to eat or drink starting at midnight prior to your appointment. Should you need to reschedule your appointment, please contact radiology at [phone]. This test typically takes about 30 minutes to perform.  Follow up pending the results of your Ultrasound and lab.

## 2020-03-17 NOTE — Progress Notes (Signed)
Attending Physician's Attestation   I have reviewed the chart.   I agree with the Advanced Practitioner's note, impression, and recommendations with any updates as below.    Garwood Wentzell Mansouraty, MD [facility] Advanced Endoscopy Office # [phone]  

## 2020-03-19 ENCOUNTER — Ambulatory Visit (HOSPITAL_COMMUNITY): Payer: Medicare Other

## 2020-03-25 ENCOUNTER — Ambulatory Visit (HOSPITAL_COMMUNITY): Admission: RE | Admit: 2020-03-25 | Payer: Medicare Other | Source: Ambulatory Visit

## 2020-07-27 DIAGNOSIS — U071 COVID-19: Secondary | ICD-10-CM

## 2020-07-27 HISTORY — DX: COVID-19: U07.1

## 2021-02-14 ENCOUNTER — Encounter: Payer: Self-pay | Admitting: Internal Medicine

## 2021-03-12 ENCOUNTER — Ambulatory Visit (AMBULATORY_SURGERY_CENTER): Payer: Medicare Other

## 2021-03-12 ENCOUNTER — Other Ambulatory Visit: Payer: Self-pay

## 2021-03-12 VITALS — Ht 65.0 in | Wt 280.0 lb

## 2021-03-12 DIAGNOSIS — Z1211 Encounter for screening for malignant neoplasm of colon: Secondary | ICD-10-CM

## 2021-03-12 MED ORDER — NA SULFATE-K SULFATE-MG SULF 17.5-3.13-1.6 GM/177ML PO SOLN: 1.0000 | Freq: Once | ORAL | 0 refills | Status: AC

## 2021-03-12 NOTE — Progress Notes (Signed)
Pre visit completed via phone call; Patient verified name, DOB, and address; No egg or soy allergy known to patient  No issues with past sedation with any surgeries or procedures Patient denies ever being told they had issues or difficulty with intubation  No FH of Malignant Hyperthermia No diet pills per patient No home 02 use per patient  No blood thinners per patient  Pt denies issues with constipation at this time; patient reports she has not had constipation in about 6-8 weeks and uses Miralax as needed; No A fib or A flutter   COVID 19 guidelines implemented in PV today with Pt and RN  Pt is fully vaccinated for Covid x 2;  NO PA's for preps discussed with pt in PV today  Discussed with pt there will be an out-of-pocket cost for prep and that varies from $0 to 70 dollars   Due to the COVID-19 pandemic we are asking patients to follow certain guidelines.  Pt aware of COVID protocols and [facility] guidelines

## 2021-03-25 ENCOUNTER — Other Ambulatory Visit: Payer: Self-pay | Admitting: Internal Medicine

## 2021-03-25 DIAGNOSIS — D127 Benign neoplasm of rectosigmoid junction: Secondary | ICD-10-CM | POA: Diagnosis not present

## 2021-03-25 DIAGNOSIS — K635 Polyp of colon: Secondary | ICD-10-CM | POA: Diagnosis not present

## 2021-03-26 ENCOUNTER — Ambulatory Visit (AMBULATORY_SURGERY_CENTER): Payer: Medicare Other | Admitting: Internal Medicine

## 2021-03-26 ENCOUNTER — Other Ambulatory Visit: Payer: Self-pay

## 2021-03-26 ENCOUNTER — Encounter: Payer: Self-pay | Admitting: Internal Medicine

## 2021-03-26 VITALS — BP 108/53 | HR 61 | Temp 98.0°F | Resp 14 | Ht 65.0 in | Wt 280.0 lb

## 2021-03-26 DIAGNOSIS — K635 Polyp of colon: Secondary | ICD-10-CM

## 2021-03-26 DIAGNOSIS — K621 Rectal polyp: Secondary | ICD-10-CM

## 2021-03-26 DIAGNOSIS — D124 Benign neoplasm of descending colon: Secondary | ICD-10-CM

## 2021-03-26 DIAGNOSIS — Z1211 Encounter for screening for malignant neoplasm of colon: Secondary | ICD-10-CM

## 2021-03-26 DIAGNOSIS — D128 Benign neoplasm of rectum: Secondary | ICD-10-CM

## 2021-03-26 DIAGNOSIS — D125 Benign neoplasm of sigmoid colon: Secondary | ICD-10-CM

## 2021-03-26 HISTORY — PX: COLONOSCOPY W/ POLYPECTOMY: SHX1380

## 2021-03-26 MED ORDER — SODIUM CHLORIDE 0.9 % IV SOLN: 500.0000 mL | Freq: Once | INTRAVENOUS | Status: DC

## 2021-03-26 NOTE — Op Note (Signed)
[facility] Patient Name: Megan Haas Procedure Date: 03/26/2021 8:36 AM MRN: [id number] Endoscopist: Jerene Bears , MD Age: 45 Referring MD:  Date of Birth: April 07, 1976 Gender: Female Account #: [id number] Procedure:                Colonoscopy Indications:              Screening for colorectal malignant neoplasm, This                            is the patient's first colonoscopy Medicines:                Monitored Anesthesia Care Procedure:                Pre-Anesthesia Assessment:                           - Prior to the procedure, a History and Physical                            was performed, and patient medications and                            allergies were reviewed. The patient's tolerance of                            previous anesthesia was also reviewed. The risks                            and benefits of the procedure and the sedation                            options and risks were discussed with the patient.                            All questions were answered, and informed consent                            was obtained. Prior Anticoagulants: The patient has                            taken no previous anticoagulant or antiplatelet                            agents. ASA Grade Assessment: III - A patient with                            severe systemic disease. After reviewing the risks                            and benefits, the patient was deemed in                            satisfactory condition to undergo the procedure.  After obtaining informed consent, the colonoscope                            was passed under direct vision. Throughout the                            procedure, the patient's blood pressure, pulse, and                            oxygen saturations were monitored continuously. The                            CF [id number] was introduced through the anus                            and advanced to the  cecum, identified by                            appendiceal orifice and ileocecal valve. The                            colonoscopy was performed without difficulty. The                            patient tolerated the procedure well. The quality                            of the bowel preparation was good. The ileocecal                            valve, appendiceal orifice, and rectum were                            photographed. Scope In: 8:46:30 AM Scope Out: 9:05:59 AM Scope Withdrawal Time: 0 hours 15 minutes 6 seconds  Total Procedure Duration: 0 hours 19 minutes 29 seconds  Findings:                 The digital rectal exam was normal.                           Two sessile polyps were found in the descending                            colon. The polyps were 3 to 4 mm in size. These                            polyps were removed with a cold snare. Resection                            and retrieval were complete.                           A 6 mm polyp was found in the sigmoid colon. The  polyp was semi-pedunculated. The polyp was removed                            with a cold snare. Resection and retrieval were                            complete.                           A 3 mm polyp was found in the rectum. The polyp was                            sessile. The polyp was removed with a cold snare.                            Resection and retrieval were complete.                           The retroflexed view of the distal rectum and anal                            verge was normal and showed no anal or rectal                            abnormalities. Complications:            No immediate complications. Estimated Blood Loss:     Estimated blood loss was minimal. Impression:               - Two 3 to 4 mm polyps in the descending colon,                            removed with a cold snare. Resected and retrieved.                           - One 6 mm polyp  in the sigmoid colon, removed with                            a cold snare. Resected and retrieved.                           - One 3 mm polyp in the rectum, removed with a cold                            snare. Resected and retrieved.                           - The distal rectum and anal verge are normal on                            retroflexion view. Recommendation:           - Patient has a contact number available for  emergencies. The signs and symptoms of potential                            delayed complications were discussed with the                            patient. Return to normal activities tomorrow.                            Written discharge instructions were provided to the                            patient.                           - Resume previous diet.                           - Continue present medications.                           - Await pathology results.                           - Repeat colonoscopy is recommended. The                            colonoscopy date will be determined after pathology                            results from today's exam become available for                            review. Jerene Bears, MD 03/26/2021 9:08:36 AM This report has been signed electronically.

## 2021-03-26 NOTE — Progress Notes (Signed)
Called to room to assist during endoscopic procedure.  Patient ID and intended procedure confirmed with present staff. Received instructions for my participation in the procedure from the performing physician.  

## 2021-03-26 NOTE — Progress Notes (Signed)
GASTROENTEROLOGY PROCEDURE H&P NOTE   Primary Care Physician: Everardo Beals, NP    Reason for Procedure:  Colon cancer screening  Plan:    Colonoscopy  Patient is appropriate for endoscopic procedure(s) in the ambulatory ([place]) setting.  The nature of the procedure, as well as the risks, benefits, and alternatives were carefully and thoroughly reviewed with the patient. Ample time for discussion and questions allowed. The patient understood, was satisfied, and agreed to proceed.     HPI: Megan Haas is a 45 y.o. female who presents for screening colonoscopy.  Medical history as below.  Seen in our office about 1 year ago for elevated LFTs which subsequently normalized.  No complaints today including recent chest pain or shortness of breath.  Tolerated the prep without issue.  Past Medical History:  Diagnosis Date   Anemia    on meds   Anxiety    Bipolar 1 disorder ([place])    on depression medications for treatment   Depression    Diabetes mellitus without complication ([place])    on meds   Fibromyalgia    Pre-diabetes    PTSD (post-traumatic stress disorder)    hx of    Past Surgical History:  Procedure Laterality Date   CHOLECYSTECTOMY  2006   GASTROPLASTY DUODENAL SWITCH  01/2019    Prior to Admission medications   Medication Sig Start Date End Date Taking? Authorizing Provider  brexpiprazole (REXULTI) 2 MG TABS tablet Take by mouth daily.   Yes [provider]  buPROPion (WELLBUTRIN XL) 150 MG 24 hr tablet Take 450 mg by mouth daily. 03/10/21  Yes [provider]  dexmethylphenidate (FOCALIN) 10 MG tablet Take 10 mg by mouth 2 (two) times daily.   Yes [provider]  gabapentin (NEURONTIN) 400 MG capsule Take 400 mg by mouth 4 (four) times daily.   Yes [provider]  VIIBRYD 40 MG TABS Take 40 mg by mouth daily. 04/13/18  Yes [provider]  vitamin k 100 MCG tablet Take 100 mcg by mouth daily.   Yes  [provider]  acetaminophen (TYLENOL) 500 MG tablet Take 1 tablet by mouth daily as needed. 01/23/19   [provider]  CALCIUM PO Take 1 capsule by mouth 3 (three) times daily.    [provider]  Iron-Vitamin C 100-250 MG TABS Take by mouth.    [provider]  Multiple Vitamins-Minerals (MULTIVITAMIN WITH MINERALS) tablet Take 1 tablet by mouth daily.    [provider]  OZEMPIC, 2 MG/DOSE, 8 MG/3ML SOPN Inject 2 mg into the skin once a week. 02/25/21   [provider]    Current Outpatient Medications  Medication Sig Dispense Refill   brexpiprazole (REXULTI) 2 MG TABS tablet Take by mouth daily.     buPROPion (WELLBUTRIN XL) 150 MG 24 hr tablet Take 450 mg by mouth daily.     dexmethylphenidate (FOCALIN) 10 MG tablet Take 10 mg by mouth 2 (two) times daily.     gabapentin (NEURONTIN) 400 MG capsule Take 400 mg by mouth 4 (four) times daily.     VIIBRYD 40 MG TABS Take 40 mg by mouth daily.  1   vitamin k 100 MCG tablet Take 100 mcg by mouth daily.     acetaminophen (TYLENOL) 500 MG tablet Take 1 tablet by mouth daily as needed.     CALCIUM PO Take 1 capsule by mouth 3 (three) times daily.     Iron-Vitamin C 100-250 MG TABS  Take by mouth.     Multiple Vitamins-Minerals (MULTIVITAMIN WITH MINERALS) tablet Take 1 tablet by mouth daily.     OZEMPIC, 2 MG/DOSE, 8 MG/3ML SOPN Inject 2 mg into the skin once a week.     Current Facility-Administered Medications  Medication Dose Route Frequency Provider Last Rate Last Admin   0.9 %  sodium chloride infusion  500 mL Intravenous Once Chi Garlow, Lajuan Lines, MD        Allergies as of 03/26/2021   (No Known Allergies)    Family History  Problem Relation Age of Onset   Diabetes Father    Hypertension Maternal Aunt    Diabetes Maternal Uncle    Hypertension Maternal Grandmother    Diabetes Paternal Grandmother    Colon cancer Neg Hx    Stomach cancer Neg Hx    Pancreatic cancer Neg Hx     Esophageal cancer Neg Hx     Social History   Socioeconomic History   Marital status: Single    Spouse name: Not on file   Number of children: Not on file   Years of education: Not on file   Highest education level: Not on file  Occupational History   Not on file  Tobacco Use   Smoking status: Former    Types: Cigarettes    Quit date: 2004    Years since quitting: 18.6   Smokeless tobacco: Never  Vaping Use   Vaping Use: Never used  Substance and Sexual Activity   Alcohol use: Not Currently    Alcohol/week: 1.0 standard drink    Types: 1 Standard drinks or equivalent per week    Comment: occasional   Drug use: Not Currently    Comment: quit 1991   Sexual activity: Not on file  Other Topics Concern   Not on file  Social History Narrative   Not on file   Social Determinants of Health   Financial Resource Strain: Not on file  Food Insecurity: Not on file  Transportation Needs: Not on file  Physical Activity: Not on file  Stress: Not on file  Social Connections: Not on file  Intimate Partner Violence: Not on file    Physical Exam: Vital signs in last 24 hours: '@BP'$  (!) 95/53 (Patient Position: Sitting)   Pulse 65   Temp 98 F (36.7 C)   Ht '5\' 5"'$  (1.651 m)   Wt 280 lb (127 kg)   LMP 03/05/2021 (Approximate)   SpO2 100%   BMI 46.59 kg/m  GEN: NAD EYE: Sclerae anicteric ENT: MMM CV: Non-tachycardic Pulm: CTA b/l GI: Soft, NT/ND NEURO:  Alert & Oriented x 3   Zenovia Jarred, MD [facility]  03/26/2021 8:39 AM

## 2021-03-26 NOTE — Patient Instructions (Signed)
HANDOUT ON POLYPS GIVEN TO YOU TODAY   AWAIT PATHOLOGY RESULTS ON POLYPS REMOVED    YOU HAD AN ENDOSCOPIC PROCEDURE TODAY AT [facility]:   Refer to the procedure report that was given to you for any specific questions about what was found during the examination.  If the procedure report does not answer your questions, please call your gastroenterologist to clarify.  If you requested that your care partner not be given the details of your procedure findings, then the procedure report has been included in a sealed envelope for you to review at your convenience later.  YOU SHOULD EXPECT: Some feelings of bloating in the abdomen. Passage of more gas than usual.  Walking can help get rid of the air that was put into your GI tract during the procedure and reduce the bloating. If you had a lower endoscopy (such as a colonoscopy or flexible sigmoidoscopy) you may notice spotting of blood in your stool or on the toilet paper. If you underwent a bowel prep for your procedure, you may not have a normal bowel movement for a few days.  Please Note:  You might notice some irritation and congestion in your nose or some drainage.  This is from the oxygen used during your procedure.  There is no need for concern and it should clear up in a day or so.  SYMPTOMS TO REPORT IMMEDIATELY:  Following lower endoscopy (colonoscopy or flexible sigmoidoscopy):  Excessive amounts of blood in the stool  Significant tenderness or worsening of abdominal pains  Swelling of the abdomen that is new, acute  Fever of 100F or higher   For urgent or emergent issues, a gastroenterologist can be reached at any hour by calling [phone]. Do not use MyChart messaging for urgent concerns.    DIET:  We do recommend a small meal at first, but then you may proceed to your regular diet.  Drink plenty of fluids but you should avoid alcoholic beverages for 24 hours.  ACTIVITY:  You should plan to take it easy  for the rest of today and you should NOT DRIVE or use heavy machinery until tomorrow (because of the sedation medicines used during the test).    FOLLOW UP: Our staff will call the number listed on your records 48-72 hours following your procedure to check on you and address any questions or concerns that you may have regarding the information given to you following your procedure. If we do not reach you, we will leave a message.  We will attempt to reach you two times.  During this call, we will ask if you have developed any symptoms of COVID 19. If you develop any symptoms (ie: fever, flu-like symptoms, shortness of breath, cough etc.) before then, please call [phone].  If you test positive for Covid 19 in the 2 weeks post procedure, please call and report this information to [place].    If any biopsies were taken you will be contacted by phone or by letter within the next 1-3 weeks.  Please call us at [phone] if you have not heard about the biopsies in 3 weeks.    SIGNATURES/CONFIDENTIALITY: You and/or your care partner have signed paperwork which will be entered into your electronic medical record.  These signatures attest to the fact that that the information above on your After Visit Summary has been reviewed and is understood.  Full responsibility of the confidentiality of this discharge information lies with you and/or your  care-partner.  

## 2021-03-26 NOTE — Progress Notes (Signed)
Pt's states no medical or surgical changes since previsit or office visit.   Check-in-AER  V/S-CW

## 2021-03-26 NOTE — Progress Notes (Signed)
Sedate but arousable, gd SR's, VSS, report to RN report to RN

## 2021-03-28 ENCOUNTER — Telehealth: Payer: Self-pay

## 2021-03-28 NOTE — Telephone Encounter (Signed)
  Follow up Call-  Call back number 03/26/2021  Post procedure Call Back phone  # [phone]  Permission to leave phone message Yes  Some recent data might be hidden     Patient questions:  Do you have a fever, pain , or abdominal swelling? No. Pain Score  0 *  Have you tolerated food without any problems? Yes.    Have you been able to return to your normal activities? Yes.    Do you have any questions about your discharge instructions: Diet   No. Medications  No. Follow up visit  No.  Do you have questions or concerns about your Care? No.  Actions: * If pain score is 4 or above: No action needed, pain <4.  Have you developed a fever since your procedure? no  2.   Have you had an respiratory symptoms (SOB or cough) since your procedure? no  3.   Have you tested positive for COVID 19 since your procedure no  4.   Have you had any family members/close contacts diagnosed with the COVID 19 since your procedure?  no   If yes to any of these questions please route to Joylene John, RN and Joella Prince, RN

## 2021-04-01 ENCOUNTER — Encounter: Payer: Self-pay | Admitting: Internal Medicine

## 2021-12-17 DIAGNOSIS — E611 Iron deficiency: Secondary | ICD-10-CM | POA: Insufficient documentation

## 2022-05-21 DIAGNOSIS — K635 Polyp of colon: Secondary | ICD-10-CM | POA: Insufficient documentation

## 2022-07-27 DIAGNOSIS — C801 Malignant (primary) neoplasm, unspecified: Secondary | ICD-10-CM

## 2022-07-27 HISTORY — DX: Malignant (primary) neoplasm, unspecified: C80.1

## 2022-08-03 ENCOUNTER — Other Ambulatory Visit: Payer: Self-pay | Admitting: Obstetrics and Gynecology

## 2022-08-03 DIAGNOSIS — R928 Other abnormal and inconclusive findings on diagnostic imaging of breast: Secondary | ICD-10-CM

## 2022-08-07 ENCOUNTER — Telehealth: Payer: Self-pay | Admitting: *Deleted

## 2022-08-07 ENCOUNTER — Encounter: Payer: Self-pay | Admitting: Gynecologic Oncology

## 2022-08-07 ENCOUNTER — Ambulatory Visit
Admission: RE | Admit: 2022-08-07 | Discharge: 2022-08-07 | Disposition: A | Discharge status: Home / Self Care | Payer: Medicare Other | Source: Ambulatory Visit | Attending: Obstetrics and Gynecology | Admitting: Obstetrics and Gynecology

## 2022-08-07 DIAGNOSIS — R928 Other abnormal and inconclusive findings on diagnostic imaging of breast: Secondary | ICD-10-CM

## 2022-08-07 NOTE — Telephone Encounter (Signed)
Spoke with the patient regarding the referral to GYN oncology. Patient scheduled as new patient with Dr Berline Lopes on 1/19 at 8 am. Patient given an arrival time of 7:30 am.  Explained to the patient the the doctor will perform a pelvic exam at this visit. Patient given the policy that no visitors under the 16 yrs are allowed in the [facility]. Patient given the address/phone number for the clinic and that the center offers free valet service. Patient aware of the new mask mandate.

## 2022-08-13 ENCOUNTER — Encounter: Payer: Self-pay | Admitting: Gynecologic Oncology

## 2022-08-13 NOTE — H&P (View-Only) (Signed)
GYNECOLOGIC ONCOLOGY NEW PATIENT CONSULTATION   Patient Name: Megan Haas  Patient Age: 47 y.o. Date of Service: 08/14/22 Referring Provider: Molli Posey, MD  Primary Care Provider: Everardo Beals, NP Consulting Provider: Jeral Pinch, MD   Assessment/Plan:  Premenopausal patient with long history of abnormal uterine bleeding with recent biopsy of prolapsing endocervical versus endometrial masses revealing adenocarcinoma.  I spent some time discussing with the patient and her husband recent biopsy.  We discussed her previous history which, per her report, include no abnormal Pap smears with most recent Pap smear less than 3 years ago.  My office will reach out to her OB/GYN for Pap and HPV history.  Additionally, in the setting of her obesity, we discussed her increased risk related to excess estrogen which would place her at risk for endometrial cancer.  Based solely on history, I would favor that recent biopsy represents endometrial rather than endocervical cancer.  To help differentiate between these 2 diagnoses, I have recommended getting several imaging studies.  The first is a pelvic ultrasound to evaluate the endometrial cavity.  The second is a PET scan to both evaluate the uterus and cervix but also to assess for metastatic disease.  Ultimately, I think that additional sampling is necessary before finalizing a treatment plan.  I am recommending that we proceed with an outpatient procedure to include hysteroscopic sampling of the endometrial cavity as well as cold knife cone biopsy.  Using results from this procedure as well as imaging studies, we will develop a treatment plan based on which of the 2 cancers (endocervical versus endometrial) is favored.  We reviewed the plan for hysteroscopy using the Myosure with endometrial sampling (D&C), cold knife conization, endocervical curettage, and any other indicated procedures.  We discussed risk which include but are not  limited to bleeding, need for blood transfusion, infection, damage to surrounding structures requiring repair or additional procedures, VTE, postoperative pneumonia, stroke, heart attack, and rarely death.  She will receive DVT and antibiotic prophylaxis as indicated.  I discussed postoperative limitations and expectations.  We reviewed that results should be back within about a week and that I will call her with these to discuss next steps in creating a treatment plan.  Perioperative instructions were reviewed with her today by Joylene John.  Postoperative medications were sent to her pharmacy.  A copy of this note was sent to the patient's referring provider.   65 minutes of total time was spent for this patient encounter, including preparation, face-to-face counseling with the patient and coordination of care, and documentation of the encounter.  Jeral Pinch, MD  Division of Gynecologic Oncology  Department of Obstetrics and Gynecology  [facility]  ___________________________________________  Chief Complaint: No chief complaint on file.   History of Present Illness:  Megan Haas is a 47 y.o. y.o. female who is seen in consultation at the request of Dr. Matthew Saras for an evaluation of adenocarcinoma.  The patient endorses a longstanding history of irregular menses.  She describes this as not being sure when her menses will start, often skipping 1 or 2 months.  Her bleeding may be light or heavy.  Menses typically last 4-5 days.  A month before she saw her OB/GYN, she had bleeding on and off for 3 weeks.  She endorses about a 4 to 90-monthhistory of occasional abdominal pain, sometimes upper abdominal pain and sometimes lower abdominal pain.  She was seen by her OB/GYN earlier this month.  At the  time of that visit, 2 or 3 larger cervical polyps were seen on exam, partially evulsed. Cervical polyp biopsy: 07/30/2022 reveals well-differentiated invasive  adenocarcinoma.  IHC positive for p16, ER and PR.  Negative for p53.  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  Since the biopsy, the patient reports having 3 days of heavier bleeding, now very light bleeding or spotting.  She also has some associated mucus discharge.  She has had some soreness since the biopsy.  Overall, she notes decreased appetite as well as occasional nausea, both she thinks related to Ozempic which she has been on for the last 1-2 years.  Because of issues with supply, she does not get medication each month.  Her baseline bowel function alternates between loose stools and slightly constipated, no recent change.  Denies any recent weight change.  Her surgical history is notable for a laparoscopic cholecystectomy almost 20 years ago and a duodenal switch performed robotically in 2020.  She lost approximately 70 pounds after this surgery but gained 40 back.  She presents with her husband today.  Family history is notable for prostate cancer in her father.  PAST MEDICAL HISTORY:  Past Medical History:  Diagnosis Date   Anemia    on meds   Anxiety    Bipolar 1 disorder ([place])    on depression medications for treatment   Depression    Diabetes mellitus without complication ([place])    on meds   Fibromyalgia    Obesity, Class III, BMI 40-49.9 (morbid obesity) ([place])    Pre-diabetes    PTSD (post-traumatic stress disorder)    hx of     PAST SURGICAL HISTORY:  Past Surgical History:  Procedure Laterality Date   CHOLECYSTECTOMY  2006   lsc   GASTROPLASTY DUODENAL SWITCH  01/2019   robotically    OB/GYN HISTORY:  OB History  Gravida Para Term Preterm AB Living  0 0 0 0 0 0  SAB IAB Ectopic Multiple Live Births  0 0 0 0 0    No LMP recorded. (Menstrual status: Irregular Periods).  Age at menarche: 66 Age at menopause: Not applicable Hx of HRT: Not applicable Hx of  STDs: Denies Last pap: 2021 History of abnormal pap smears: Patient denies abnormal Pap smear history  SCREENING STUDIES:  Last mammogram: 2024  Last colonoscopy: 2022  MEDICATIONS: Outpatient Encounter Medications as of 08/14/2022  Medication Sig   acetaminophen (TYLENOL) 500 MG tablet Take 1 tablet by mouth daily as needed.   brexpiprazole (REXULTI) 2 MG TABS tablet Take by mouth daily.   buPROPion (WELLBUTRIN XL) 150 MG 24 hr tablet Take 450 mg by mouth daily.   CALCIUM PO Take 1 capsule by mouth 3 (three) times daily.   dexmethylphenidate (FOCALIN) 10 MG tablet Take 10 mg by mouth 2 (two) times daily.   gabapentin (NEURONTIN) 400 MG capsule Take 400 mg by mouth 4 (four) times daily.   ipratropium (ATROVENT) 0.03 % nasal spray INSTILL TWO SPRAYS IN BOTH NOSTRILS ROUTE EVERY 12 (TWELVE) HOURS.   Iron-Vitamin C 100-250 MG TABS Take by mouth.   Multiple Vitamins-Minerals (MULTIVITAMIN WITH MINERALS) tablet Take 1 tablet by mouth daily.   OZEMPIC, 2 MG/DOSE, 8 MG/3ML SOPN Inject 2 mg into the skin once a week.   senna-docusate (SENOKOT-S) 8.6-50 MG tablet Take 2 tablets by mouth at bedtime. For AFTER surgery, do not take if having diarrhea   traMADol (ULTRAM) 50 MG  tablet Take 1 tablet (50 mg total) by mouth every 6 (six) hours as needed for severe pain. For AFTER surgery only, do not take and drive   VIIBRYD 40 MG TABS Take 40 mg by mouth daily.   vitamin k 100 MCG tablet Take 100 mcg by mouth daily.   No facility-administered encounter medications on file as of 08/14/2022.    ALLERGIES:  No Known Allergies   FAMILY HISTORY:  Family History  Problem Relation Age of Onset   Diabetes Father    Prostate cancer Father    Hypertension Maternal Aunt    Diabetes Maternal Uncle    Hypertension Maternal Grandmother    Diabetes Paternal Grandmother    Colon cancer Neg Hx    Stomach cancer Neg Hx    Pancreatic cancer Neg Hx    Esophageal cancer Neg Hx    Breast cancer Neg Hx     Ovarian cancer Neg Hx    Endometrial cancer Neg Hx      SOCIAL HISTORY:  Social Connections: Not on file    REVIEW OF SYSTEMS:  + Pelvic soreness after recent biopsy, light vaginal bleeding, muscle pain and cramping related to fibromyalgia. Denies appetite changes, fevers, chills, fatigue, unexplained weight changes. Denies hearing loss, neck lumps or masses, mouth sores, ringing in ears or voice changes. Denies cough or wheezing.  Denies shortness of breath. Denies chest pain or palpitations. Denies leg swelling. Denies abdominal distention, pain, blood in stools, nausea, vomiting, or early satiety. Denies pain with intercourse, dysuria, frequency, hematuria or incontinence. Denies hot flashes.   Denies joint pain, back pain. Denies itching, rash, or wounds. Denies dizziness, headaches, numbness or seizures. Denies swollen lymph nodes or glands, denies easy bruising or bleeding. Denies anxiety, depression, confusion, or decreased concentration.  Physical Exam:  Vital Signs for this encounter:  Blood pressure (!) 138/94, pulse 68, temperature 97.7 F (36.5 C), resp. rate 18, height '5\' 5"'$  (1.651 m), weight 296 lb (134.3 kg). Body mass index is 49.26 kg/m. General: Alert, oriented, no acute distress.  HEENT: Normocephalic, atraumatic. Sclera anicteric.  Chest: Clear to auscultation bilaterally. No wheezes, rhonchi, or rales. Cardiovascular: Regular rate and rhythm, no murmurs, rubs, or gallops.  Abdomen: Obese. Normoactive bowel sounds. Soft, nondistended, nontender to palpation. No masses or hepatosplenomegaly appreciated. No palpable fluid wave.  Multiple well-healed laparoscopic incisions. Extremities: Grossly normal range of motion. Warm, well perfused. No edema bilaterally.  Skin: No rashes or lesions.  Lymphatics: No cervical, supraclavicular, or inguinal adenopathy.  GU:  Normal external female genitalia. No lesions. No discharge or bleeding.             Bladder/urethra:   No lesions or masses, well supported bladder             Vagina: Well-rugated, no lesions.  Minimal older appearing blood at the apex of the vaginal vault.             Cervix: The cervix itself is normal in appearance around at least 2 flesh-colored prolapsing masses, extending 3-4 cm from the cervix which is visually dilated.  On bimanual exam, the cervix itself is palpably normal with polyps prolapsing through the os.  No discrete parametrial involvement or nodularity palpated.             Uterus: Exam somewhat limited by body habitus, mobile uterus.              Adnexa: No masses appreciated.  Rectal: Deferred.  LABORATORY AND RADIOLOGIC DATA:  Outside medical records were reviewed to synthesize the above history, along with the history and physical obtained during the visit.   Lab Results  Component Value Date   ALT 29 03/14/2020   AST 18 03/14/2020

## 2022-08-13 NOTE — Progress Notes (Signed)
GYNECOLOGIC ONCOLOGY NEW PATIENT CONSULTATION   Patient Name: Megan Haas  Patient Age: 47 y.o. Date of Service: 08/14/22 Referring Provider: Molli Posey, MD  Primary Care Provider: Everardo Beals, NP Consulting Provider: Jeral Pinch, MD   Assessment/Plan:  Premenopausal patient with long history of abnormal uterine bleeding with recent biopsy of prolapsing endocervical versus endometrial masses revealing adenocarcinoma.  I spent some time discussing with the patient and her husband recent biopsy.  We discussed her previous history which, per her report, include no abnormal Pap smears with most recent Pap smear less than 3 years ago.  My office will reach out to her OB/GYN for Pap and HPV history.  Additionally, in the setting of her obesity, we discussed her increased risk related to excess estrogen which would place her at risk for endometrial cancer.  Based solely on history, I would favor that recent biopsy represents endometrial rather than endocervical cancer.  To help differentiate between these 2 diagnoses, I have recommended getting several imaging studies.  The first is a pelvic ultrasound to evaluate the endometrial cavity.  The second is a PET scan to both evaluate the uterus and cervix but also to assess for metastatic disease.  Ultimately, I think that additional sampling is necessary before finalizing a treatment plan.  I am recommending that we proceed with an outpatient procedure to include hysteroscopic sampling of the endometrial cavity as well as cold knife cone biopsy.  Using results from this procedure as well as imaging studies, we will develop a treatment plan based on which of the 2 cancers (endocervical versus endometrial) is favored.  We reviewed the plan for hysteroscopy using the Myosure with endometrial sampling (D&C), cold knife conization, endocervical curettage, and any other indicated procedures.  We discussed risk which include but are not  limited to bleeding, need for blood transfusion, infection, damage to surrounding structures requiring repair or additional procedures, VTE, postoperative pneumonia, stroke, heart attack, and rarely death.  She will receive DVT and antibiotic prophylaxis as indicated.  I discussed postoperative limitations and expectations.  We reviewed that results should be back within about a week and that I will call her with these to discuss next steps in creating a treatment plan.  Perioperative instructions were reviewed with her today by Joylene John.  Postoperative medications were sent to her pharmacy.  A copy of this note was sent to the patient's referring provider.   65 minutes of total time was spent for this patient encounter, including preparation, face-to-face counseling with the patient and coordination of care, and documentation of the encounter.  Jeral Pinch, MD  Division of Gynecologic Oncology  Department of Obstetrics and Gynecology  [facility]  ___________________________________________  Chief Complaint: No chief complaint on file.   History of Present Illness:  Megan Haas is a 47 y.o. y.o. female who is seen in consultation at the request of Dr. Matthew Saras for an evaluation of adenocarcinoma.  The patient endorses a longstanding history of irregular menses.  She describes this as not being sure when her menses will start, often skipping 1 or 2 months.  Her bleeding may be light or heavy.  Menses typically last 4-5 days.  A month before she saw her OB/GYN, she had bleeding on and off for 3 weeks.  She endorses about a 4 to 22-monthhistory of occasional abdominal pain, sometimes upper abdominal pain and sometimes lower abdominal pain.  She was seen by her OB/GYN earlier this month.  At the  time of that visit, 2 or 3 larger cervical polyps were seen on exam, partially evulsed. Cervical polyp biopsy: 07/30/2022 reveals well-differentiated invasive  adenocarcinoma.  IHC positive for p16, ER and PR.  Negative for p53.  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  Since the biopsy, the patient reports having 3 days of heavier bleeding, now very light bleeding or spotting.  She also has some associated mucus discharge.  She has had some soreness since the biopsy.  Overall, she notes decreased appetite as well as occasional nausea, both she thinks related to Ozempic which she has been on for the last 1-2 years.  Because of issues with supply, she does not get medication each month.  Her baseline bowel function alternates between loose stools and slightly constipated, no recent change.  Denies any recent weight change.  Her surgical history is notable for a laparoscopic cholecystectomy almost 20 years ago and a duodenal switch performed robotically in 2020.  She lost approximately 70 pounds after this surgery but gained 40 back.  She presents with her husband today.  Family history is notable for prostate cancer in her father.  PAST MEDICAL HISTORY:  Past Medical History:  Diagnosis Date   Anemia    on meds   Anxiety    Bipolar 1 disorder ([place])    on depression medications for treatment   Depression    Diabetes mellitus without complication ([place])    on meds   Fibromyalgia    Obesity, Class III, BMI 40-49.9 (morbid obesity) ([place])    Pre-diabetes    PTSD (post-traumatic stress disorder)    hx of     PAST SURGICAL HISTORY:  Past Surgical History:  Procedure Laterality Date   CHOLECYSTECTOMY  2006   lsc   GASTROPLASTY DUODENAL SWITCH  01/2019   robotically    OB/GYN HISTORY:  OB History  Gravida Para Term Preterm AB Living  0 0 0 0 0 0  SAB IAB Ectopic Multiple Live Births  0 0 0 0 0    No LMP recorded. (Menstrual status: Irregular Periods).  Age at menarche: 66 Age at menopause: Not applicable Hx of HRT: Not applicable Hx of  STDs: Denies Last pap: 2021 History of abnormal pap smears: Patient denies abnormal Pap smear history  SCREENING STUDIES:  Last mammogram: 2024  Last colonoscopy: 2022  MEDICATIONS: Outpatient Encounter Medications as of 08/14/2022  Medication Sig   acetaminophen (TYLENOL) 500 MG tablet Take 1 tablet by mouth daily as needed.   brexpiprazole (REXULTI) 2 MG TABS tablet Take by mouth daily.   buPROPion (WELLBUTRIN XL) 150 MG 24 hr tablet Take 450 mg by mouth daily.   CALCIUM PO Take 1 capsule by mouth 3 (three) times daily.   dexmethylphenidate (FOCALIN) 10 MG tablet Take 10 mg by mouth 2 (two) times daily.   gabapentin (NEURONTIN) 400 MG capsule Take 400 mg by mouth 4 (four) times daily.   ipratropium (ATROVENT) 0.03 % nasal spray INSTILL TWO SPRAYS IN BOTH NOSTRILS ROUTE EVERY 12 (TWELVE) HOURS.   Iron-Vitamin C 100-250 MG TABS Take by mouth.   Multiple Vitamins-Minerals (MULTIVITAMIN WITH MINERALS) tablet Take 1 tablet by mouth daily.   OZEMPIC, 2 MG/DOSE, 8 MG/3ML SOPN Inject 2 mg into the skin once a week.   senna-docusate (SENOKOT-S) 8.6-50 MG tablet Take 2 tablets by mouth at bedtime. For AFTER surgery, do not take if having diarrhea   traMADol (ULTRAM) 50 MG  tablet Take 1 tablet (50 mg total) by mouth every 6 (six) hours as needed for severe pain. For AFTER surgery only, do not take and drive   VIIBRYD 40 MG TABS Take 40 mg by mouth daily.   vitamin k 100 MCG tablet Take 100 mcg by mouth daily.   No facility-administered encounter medications on file as of 08/14/2022.    ALLERGIES:  No Known Allergies   FAMILY HISTORY:  Family History  Problem Relation Age of Onset   Diabetes Father    Prostate cancer Father    Hypertension Maternal Aunt    Diabetes Maternal Uncle    Hypertension Maternal Grandmother    Diabetes Paternal Grandmother    Colon cancer Neg Hx    Stomach cancer Neg Hx    Pancreatic cancer Neg Hx    Esophageal cancer Neg Hx    Breast cancer Neg Hx     Ovarian cancer Neg Hx    Endometrial cancer Neg Hx      SOCIAL HISTORY:  Social Connections: Not on file    REVIEW OF SYSTEMS:  + Pelvic soreness after recent biopsy, light vaginal bleeding, muscle pain and cramping related to fibromyalgia. Denies appetite changes, fevers, chills, fatigue, unexplained weight changes. Denies hearing loss, neck lumps or masses, mouth sores, ringing in ears or voice changes. Denies cough or wheezing.  Denies shortness of breath. Denies chest pain or palpitations. Denies leg swelling. Denies abdominal distention, pain, blood in stools, nausea, vomiting, or early satiety. Denies pain with intercourse, dysuria, frequency, hematuria or incontinence. Denies hot flashes.   Denies joint pain, back pain. Denies itching, rash, or wounds. Denies dizziness, headaches, numbness or seizures. Denies swollen lymph nodes or glands, denies easy bruising or bleeding. Denies anxiety, depression, confusion, or decreased concentration.  Physical Exam:  Vital Signs for this encounter:  Blood pressure (!) 138/94, pulse 68, temperature 97.7 F (36.5 C), resp. rate 18, height '5\' 5"'$  (1.651 m), weight 296 lb (134.3 kg). Body mass index is 49.26 kg/m. General: Alert, oriented, no acute distress.  HEENT: Normocephalic, atraumatic. Sclera anicteric.  Chest: Clear to auscultation bilaterally. No wheezes, rhonchi, or rales. Cardiovascular: Regular rate and rhythm, no murmurs, rubs, or gallops.  Abdomen: Obese. Normoactive bowel sounds. Soft, nondistended, nontender to palpation. No masses or hepatosplenomegaly appreciated. No palpable fluid wave.  Multiple well-healed laparoscopic incisions. Extremities: Grossly normal range of motion. Warm, well perfused. No edema bilaterally.  Skin: No rashes or lesions.  Lymphatics: No cervical, supraclavicular, or inguinal adenopathy.  GU:  Normal external female genitalia. No lesions. No discharge or bleeding.             Bladder/urethra:   No lesions or masses, well supported bladder             Vagina: Well-rugated, no lesions.  Minimal older appearing blood at the apex of the vaginal vault.             Cervix: The cervix itself is normal in appearance around at least 2 flesh-colored prolapsing masses, extending 3-4 cm from the cervix which is visually dilated.  On bimanual exam, the cervix itself is palpably normal with polyps prolapsing through the os.  No discrete parametrial involvement or nodularity palpated.             Uterus: Exam somewhat limited by body habitus, mobile uterus.              Adnexa: No masses appreciated.  Rectal: Deferred.  LABORATORY AND RADIOLOGIC DATA:  Outside medical records were reviewed to synthesize the above history, along with the history and physical obtained during the visit.   Lab Results  Component Value Date   ALT 29 03/14/2020   AST 18 03/14/2020

## 2022-08-14 ENCOUNTER — Other Ambulatory Visit: Payer: Self-pay

## 2022-08-14 ENCOUNTER — Encounter: Payer: Self-pay | Admitting: Gynecologic Oncology

## 2022-08-14 ENCOUNTER — Inpatient Hospital Stay: Payer: Medicare Other | Attending: Gynecologic Oncology | Admitting: Gynecologic Oncology

## 2022-08-14 ENCOUNTER — Inpatient Hospital Stay (HOSPITAL_BASED_OUTPATIENT_CLINIC_OR_DEPARTMENT_OTHER): Payer: Medicare Other | Admitting: Gynecologic Oncology

## 2022-08-14 VITALS — BP 138/94 | HR 68 | Temp 97.7°F | Resp 18 | Ht 65.0 in | Wt 296.0 lb

## 2022-08-14 DIAGNOSIS — C801 Malignant (primary) neoplasm, unspecified: Secondary | ICD-10-CM | POA: Diagnosis present

## 2022-08-14 DIAGNOSIS — Z6841 Body Mass Index (BMI) 40.0 and over, adult: Secondary | ICD-10-CM | POA: Diagnosis not present

## 2022-08-14 DIAGNOSIS — Z79899 Other long term (current) drug therapy: Secondary | ICD-10-CM | POA: Diagnosis not present

## 2022-08-14 DIAGNOSIS — N970 Female infertility associated with anovulation: Secondary | ICD-10-CM

## 2022-08-14 DIAGNOSIS — E280 Estrogen excess: Secondary | ICD-10-CM | POA: Diagnosis not present

## 2022-08-14 DIAGNOSIS — E119 Type 2 diabetes mellitus without complications: Secondary | ICD-10-CM | POA: Diagnosis not present

## 2022-08-14 DIAGNOSIS — F319 Bipolar disorder, unspecified: Secondary | ICD-10-CM | POA: Diagnosis not present

## 2022-08-14 DIAGNOSIS — E66813 Obesity, class 3: Secondary | ICD-10-CM | POA: Insufficient documentation

## 2022-08-14 DIAGNOSIS — M797 Fibromyalgia: Secondary | ICD-10-CM | POA: Diagnosis not present

## 2022-08-14 DIAGNOSIS — F4310 Post-traumatic stress disorder, unspecified: Secondary | ICD-10-CM | POA: Diagnosis not present

## 2022-08-14 DIAGNOSIS — N939 Abnormal uterine and vaginal bleeding, unspecified: Secondary | ICD-10-CM

## 2022-08-14 MED ORDER — TRAMADOL HCL 50 MG PO TABS: 50.0000 mg | ORAL_TABLET | Freq: Four times a day (QID) | ORAL | 0 refills | Status: DC | PRN

## 2022-08-14 MED ORDER — SENNOSIDES-DOCUSATE SODIUM 8.6-50 MG PO TABS: 2.0000 | ORAL_TABLET | Freq: Every day | ORAL | 0 refills | Status: DC

## 2022-08-14 NOTE — Patient Instructions (Addendum)
It was very nice to meet you today.  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  I am working to get your Pap smear records from your OB/GYN.  In the meantime, we discussed getting several things set up.  The first are imaging studies including a pelvic ultrasound (this will help [place] look at the lining of your uterus) as well as a PET scan.  A PET scan will hopefully help to determine whether the site of origin of the cancer is the cervix or the uterus and will help [place] assess for cancer spread or metastatic disease.  Lastly, I would like to get you set up for procedure to get additional tissue.  This will include a D&C with hysteroscopy (using a camera to look inside the uterus and collecting a sample) as well as a cold knife cone biopsy (this is a larger biopsy of the cervix itself).  Once we have better determined whether this cancer is coming from the uterus or the cervix, that we will allow [place] to make a treatment plan.  Preparing for your Surgery  Plan for surgery on August 27, 2022 with Dr. Jeral Pinch at [facility]. You will be scheduled for examination under anesthesia, dilation and curettage of the uterus with hysteroscopy (looking with a camera), cold knife conization of the cervix (cone shaped biopsy of the cervix), endocervical curettage (sampling from the inner cervix).   Pre-operative Testing -You will receive a phone call from presurgical testing at [facility] to discuss surgery instructions and arrange for lab work if needed.  -Bring your insurance card, copy of an advanced directive if applicable, medication list.  -You should not be taking blood thinners or aspirin at least ten days prior to surgery unless instructed by your surgeon.  -Do not take supplements such as fish oil (omega 3), red yeast rice, turmeric before  your surgery. You want to avoid medications with aspirin in them including headache powders such as BC or Goody's), Excedrin migraine.  -Recommend holding vitamin K at least 5 days before surgery and after.  Day Before Surgery at [place] will be advised you can have clear liquids up until 3 hours before your surgery.    Your role in recovery Your role is to become active as soon as directed by your doctor, while still giving yourself time to heal.  Rest when you feel tired. You will be asked to do the following in order to speed your recovery:  - Cough and breathe deeply. This helps to clear and expand your lungs and can prevent pneumonia after surgery.  - [place]. Do mild physical activity. Walking or moving your legs help your circulation and body functions return to normal. Do not try to get up or walk alone the first time after surgery.   -If you develop swelling on one leg or the other, pain in the back of your leg, redness/warmth in one of your legs, please call the office or go to the Emergency Room to have a doppler to rule out a blood clot. For shortness of breath, chest pain-seek care in the Emergency Room as soon as possible. - Actively manage your pain. Managing your pain lets you move in comfort. We will ask you to rate your pain on a scale of zero to 10. It is your responsibility to tell  your doctor or nurse where and how much you hurt so your pain can be treated.  Special Considerations -Your final pathology results from surgery should be available around one week after surgery and the results will be relayed to you when available.  -FMLA forms can be faxed to [phone] and please allow 5-7 business days for completion.  Pain Management After Surgery -You will be prescribed your pain medication and bowel regimen medications before surgery so that you can have these available when you are discharged from the hospital. The pain medication is for use ONLY  AFTER surgery and a new prescription will not be given.   -Make sure that you have Tylenol and Ibuprofen at home IF [place] to use on a regular basis after surgery for pain control. We recommend alternating the medications every hour to six hours since they work differently and are processed in the body differently for pain relief.  -Review the attached handout on narcotic use and their risks and side effects.   Bowel Regimen -You will be prescribed Sennakot-S to take nightly to prevent constipation especially if you are taking the narcotic pain medication intermittently.  It is important to prevent constipation and drink adequate amounts of liquids. You can stop taking this medication when you are not taking pain medication and you are back on your normal bowel routine.  Risks of Surgery Risks of surgery are low but include bleeding, infection, damage to surrounding structures, re-operation, blood clots, and very rarely death.  AFTER SURGERY INSTRUCTIONS  Return to work:  Variable based on occupation  Activity: 1. Be up and out of the bed during the day.  Take a nap if needed.  You may walk up steps but be careful and use the hand rail.  Stair climbing will tire you more than you think, you may need to stop part way and rest.   2. No lifting or straining for 2 weeks minimum over 10 pounds. No pushing, pulling, straining for 2 weeks.  3. No driving for minimum 24 hours after surgery.  Do not drive if you are taking narcotic pain medicine and make sure that your reaction time has returned.   4. You can shower as soon as the next day after surgery. Shower daily. No tub baths or submerging your body in water until cleared by your surgeon. If you have the soap that was given to you by pre-surgical testing that was used before surgery, you do not need to use it afterwards because this can irritate your incisions.   5. No sexual activity and nothing in the vagina for  4 weeks.  6. You may experience vaginal spotting and discharge after surgery.  The spotting is normal but if you experience heavy bleeding, call our office.  7. Take Tylenol or ibuprofen first for pain if you are able to take these medications and only use narcotic pain medication for severe pain not relieved by the Tylenol or Ibuprofen.  Monitor your Tylenol intake to a max of 4,000 mg in a 24 hour period. You can alternate these medications after surgery.  Diet: 1. Low sodium Heart Healthy Diet is recommended but you are cleared to resume your normal (before surgery) diet after your procedure.  2. It is safe to use a laxative, such as Miralax or Colace, if you have difficulty moving your bowels. You have been prescribed Sennakot at bedtime every evening to keep bowel movements regular and to prevent constipation.  Wound Care: 1. Keep clean and dry.  Shower daily.  Reasons to call the Doctor: Fever - Oral temperature greater than 100.4 degrees Fahrenheit Foul-smelling vaginal discharge Difficulty urinating Nausea and vomiting Increased pain at the site of the incision that is unrelieved with pain medicine. Difficulty breathing with or without chest pain New calf pain especially if only on one side Sudden, continuing increased vaginal bleeding with or without clots.   Contacts: For questions or concerns you should contact:  Dr. Jeral Pinch at [phone]  Joylene John, NP at [phone]  After Hours: call [phone] and have the GYN Oncologist paged/contacted (after 5 pm or on the weekends).  Messages sent via mychart are for non-urgent matters and are not responded to after hours so for urgent needs, please call the after hours number.

## 2022-08-17 ENCOUNTER — Other Ambulatory Visit: Payer: Self-pay | Admitting: Gynecologic Oncology

## 2022-08-17 ENCOUNTER — Ambulatory Visit
Admission: RE | Admit: 2022-08-17 | Discharge: 2022-08-17 | Disposition: A | Discharge status: Home / Self Care | Payer: Self-pay | Source: Ambulatory Visit | Attending: Gynecologic Oncology | Admitting: Gynecologic Oncology

## 2022-08-17 ENCOUNTER — Other Ambulatory Visit: Payer: Self-pay

## 2022-08-17 ENCOUNTER — Telehealth: Payer: Self-pay

## 2022-08-17 DIAGNOSIS — N939 Abnormal uterine and vaginal bleeding, unspecified: Secondary | ICD-10-CM

## 2022-08-17 DIAGNOSIS — C801 Malignant (primary) neoplasm, unspecified: Secondary | ICD-10-CM

## 2022-08-17 MED ORDER — MEDROXYPROGESTERONE ACETATE 10 MG PO TABS: 10.0000 mg | ORAL_TABLET | Freq: Every day | ORAL | 1 refills | Status: DC

## 2022-08-17 NOTE — Telephone Encounter (Signed)
Can send in provera 10 mg for her to start to help slow/stop bleeding

## 2022-08-17 NOTE — Patient Instructions (Signed)
Preparing for your Surgery   Plan for surgery on August 27, 2022 with Dr. Jeral Pinch at [facility]. You will be scheduled for examination under anesthesia, dilation and curettage of the uterus with hysteroscopy (looking with a camera), cold knife conization of the cervix (cone shaped biopsy of the cervix), endocervical curettage (sampling from the inner cervix).    Pre-operative Testing -You will receive a phone call from presurgical testing at [facility] to discuss surgery instructions and arrange for lab work if needed.   -Bring your insurance card, copy of an advanced directive if applicable, medication list.   -You should not be taking blood thinners or aspirin at least ten days prior to surgery unless instructed by your surgeon.   -Do not take supplements such as fish oil (omega 3), red yeast rice, turmeric before your surgery. You want to avoid medications with aspirin in them including headache powders such as BC or Goody's), Excedrin migraine.   -Recommend holding vitamin K at least 5 days before surgery and after.   Day Before Surgery at [place] will be advised you can have clear liquids up until 3 hours before your surgery.     Your role in recovery Your role is to become active as soon as directed by your doctor, while still giving yourself time to heal.  Rest when you feel tired. You will be asked to do the following in order to speed your recovery:   - Cough and breathe deeply. This helps to clear and expand your lungs and can prevent pneumonia after surgery.  - [place]. Do mild physical activity. Walking or moving your legs help your circulation and body functions return to normal. Do not try to get up or walk alone the first time after surgery.   -If you develop swelling on one leg or the other, pain in the back of your leg, redness/warmth in one of your legs, please call the office or go to the Emergency  Room to have a doppler to rule out a blood clot. For shortness of breath, chest pain-seek care in the Emergency Room as soon as possible. - Actively manage your pain. Managing your pain lets you move in comfort. We will ask you to rate your pain on a scale of zero to 10. It is your responsibility to tell your doctor or nurse where and how much you hurt so your pain can be treated.   Special Considerations -Your final pathology results from surgery should be available around one week after surgery and the results will be relayed to you when available.   -FMLA forms can be faxed to [phone] and please allow 5-7 business days for completion.   Pain Management After Surgery -You will be prescribed your pain medication and bowel regimen medications before surgery so that you can have these available when you are discharged from the hospital. The pain medication is for use ONLY AFTER surgery and a new prescription will not be given.    -Make sure that you have Tylenol and Ibuprofen at home IF [place] to use on a regular basis after surgery for pain control. We recommend alternating the medications every hour to six hours since they work differently and are processed in the body differently for pain relief.   -Review the attached handout on narcotic use and their risks and side effects.    Bowel Regimen -You will be prescribed  Sennakot-S to take nightly to prevent constipation especially if you are taking the narcotic pain medication intermittently.  It is important to prevent constipation and drink adequate amounts of liquids. You can stop taking this medication when you are not taking pain medication and you are back on your normal bowel routine.   Risks of Surgery Risks of surgery are low but include bleeding, infection, damage to surrounding structures, re-operation, blood clots, and very rarely death.   AFTER SURGERY INSTRUCTIONS   Return to work:  Variable  based on occupation   Activity: 1. Be up and out of the bed during the day.  Take a nap if needed.  You may walk up steps but be careful and use the hand rail.  Stair climbing will tire you more than you think, you may need to stop part way and rest.    2. No lifting or straining for 2 weeks minimum over 10 pounds. No pushing, pulling, straining for 2 weeks.   3. No driving for minimum 24 hours after surgery.  Do not drive if you are taking narcotic pain medicine and make sure that your reaction time has returned.    4. You can shower as soon as the next day after surgery. Shower daily. No tub baths or submerging your body in water until cleared by your surgeon. If you have the soap that was given to you by pre-surgical testing that was used before surgery, you do not need to use it afterwards because this can irritate your incisions.    5. No sexual activity and nothing in the vagina for 4 weeks.   6. You may experience vaginal spotting and discharge after surgery.  The spotting is normal but if you experience heavy bleeding, call our office.   7. Take Tylenol or ibuprofen first for pain if you are able to take these medications and only use narcotic pain medication for severe pain not relieved by the Tylenol or Ibuprofen.  Monitor your Tylenol intake to a max of 4,000 mg in a 24 hour period. You can alternate these medications after surgery.   Diet: 1. Low sodium Heart Healthy Diet is recommended but you are cleared to resume your normal (before surgery) diet after your procedure.   2. It is safe to use a laxative, such as Miralax or Colace, if you have difficulty moving your bowels. You have been prescribed Sennakot at bedtime every evening to keep bowel movements regular and to prevent constipation.     Wound Care: 1. Keep clean and dry.  Shower daily.   Reasons to call the Doctor: Fever - Oral temperature greater than 100.4 degrees Fahrenheit Foul-smelling vaginal  discharge Difficulty urinating Nausea and vomiting Increased pain at the site of the incision that is unrelieved with pain medicine. Difficulty breathing with or without chest pain New calf pain especially if only on one side Sudden, continuing increased vaginal bleeding with or without clots.   Contacts: For questions or concerns you should contact:   Dr. Jeral Pinch at [phone]   Joylene John, NP at [phone]   After Hours: call [phone] and have the GYN Oncologist paged/contacted (after 5 pm or on the weekends).   Messages sent via mychart are for non-urgent matters and are not responded to after hours so for urgent needs, please call the after hours number.

## 2022-08-17 NOTE — Progress Notes (Signed)
Patient here with family for new patient consultation with Dr. Berline Lopes and for a pre-operative appointment prior to her scheduled surgery on 08/27/2022. She is scheduled for examination under anesthesia, dilation and curettage of the uterus with hysteroscopy, cold knife conization of the cervix, endocervical curettage .  The surgery was discussed in detail.  See after visit summary for additional details. Visual aids used to discuss items related to surgery.      Discussed post-op pain management in detail including the aspects of the enhanced recovery pathway.  Advised her that a new prescription would be sent in for tramadol and it is only to be used for after her upcoming surgery.  We discussed the use of tylenol post-op and to monitor for a maximum of 4,000 mg in a 24 hour period.  Also prescribed sennakot to be used after surgery and to hold if having loose stools.  Discussed bowel regimen in detail.     Discussed the use of SCDs and measures to take at home to prevent DVT including frequent mobility.  Reportable signs and symptoms of DVT discussed. Post-operative instructions discussed and expectations for after surgery.     10 minutes spent with the patient and preparing information/materials.  Verbalizing understanding of material discussed. No needs or concerns voiced at the end of the visit.   Advised patient and family to call for any needs.  Advised that her post-operative medications had been prescribed and could be picked up at any time.    This appointment is included in the global surgical bundle as pre-operative teaching and has no charge.

## 2022-08-17 NOTE — Progress Notes (Signed)
Spoke with patient. Bleeding significantly improved from last night when in ED. Discussed plan to start provera. Script sent to pharmacy. Recommended starting with q day dosing, can increase to BID if bleeding doesn't continue to decrease. Asked her to call back with any significant change to bleeding or other symptoms.  Jeral Pinch MD Gynecologic Oncology

## 2022-08-17 NOTE — Telephone Encounter (Signed)
Images from Ultrasound are in Epic.

## 2022-08-17 NOTE — H&P (View-Only) (Signed)
Spoke with patient. Bleeding significantly improved from last night when in ED. Discussed plan to start provera. Script sent to pharmacy. Recommended starting with q day dosing, can increase to BID if bleeding doesn't continue to decrease. Asked her to call back with any significant change to bleeding or other symptoms.  Jeral Pinch MD Gynecologic Oncology

## 2022-08-17 NOTE — Telephone Encounter (Signed)
Charles, Megan Haas husband, called this morning to let Dr. Berline Lopes know she was seen in the ER last night.(They were discharges at 6:00am this morning) He states Megan Haas went in with excessive bleeding, decrease in BP and nausea with dry heaving. In the ER, labs were done and an ultrasound showed thickening in the uterine lining (per husband). He stated the ER told him Dr. Berline Lopes would want to be seen today in office.   (At the time of the call)Charles is aware the MD ER note has not been finished. He was notified Dr. Berline Lopes is in the office this afternoon and he would get a call back.

## 2022-08-19 ENCOUNTER — Encounter (HOSPITAL_BASED_OUTPATIENT_CLINIC_OR_DEPARTMENT_OTHER): Payer: Self-pay | Admitting: Gynecologic Oncology

## 2022-08-19 ENCOUNTER — Other Ambulatory Visit: Payer: Self-pay

## 2022-08-19 ENCOUNTER — Ambulatory Visit (HOSPITAL_COMMUNITY): Admission: RE | Admit: 2022-08-19 | Payer: Medicare Other | Source: Ambulatory Visit

## 2022-08-19 NOTE — Progress Notes (Signed)
Spoke w/ via phone for pre-op interview---Megan Haas needs dos----urine pregnancy, ISTAT, EKG               Haas results------08/17/22 CBC in Epic COVID test -----patient states asymptomatic no test needed Arrive at -------1230 on Thursday, 08/27/22 NPO after MN NO Solid Food.  Clear liquids from MN until---1130 Med rec completed Medications to take morning of surgery -----Rexulti, Wellbutrin, Neurontin, Atrovent, Provera, Viibrid. Patient instructed to hold Focalin on day of surgery.  Diabetic medication -----Patient is holding Ozempic prior to surgery. Per pt, her last dose was 08/13/22. Patient instructed no nail polish to be worn day of surgery Patient instructed to bring photo id and insurance card day of surgery Patient aware to have Driver (ride ) / caregiver    for 24 hours after surgery - Bing Quarry, husband Patient Special Instructions -----none Pre-Op special Istructions -----none Patient verbalized understanding of instructions that were given at this phone interview. Patient denies shortness of breath, chest pain, fever, cough at this phone interview.

## 2022-08-26 ENCOUNTER — Telehealth: Payer: Self-pay | Admitting: *Deleted

## 2022-08-26 NOTE — Telephone Encounter (Signed)
Telephone call to check on pre-operative status.  Patient compliant with pre-operative instructions.  Reinforced nothing to eat after midnight. Clear liquids until 1130. Patient to arrive at 1230.     Patient had last dose of Ozempic and Vit K on 08-13-22.  Discussed frequent ambulation after surgery. No questions or concerns voiced.  Instructed to call for any needs.

## 2022-08-26 NOTE — Anesthesia Preprocedure Evaluation (Addendum)
Anesthesia Evaluation  Patient identified by MRN, date of birth, ID band Patient awake    Reviewed: Allergy & Precautions, NPO status , Patient's Chart, lab work & pertinent test results  Airway Mallampati: II  TM Distance: >3 FB Neck ROM: Full    Dental no notable dental hx. 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)     Musculoskeletal  (+) Arthritis ,  Fibromyalgia -  Abdominal   Peds  Hematology   Anesthesia Other Findings   Reproductive/Obstetrics                             Anesthesia Physical Anesthesia Plan  ASA: 3  Anesthesia Plan: General   Post-op Pain Management: Minimal or no pain anticipated   Induction: Intravenous  PONV Risk Score and Plan: Treatment may vary due to age or medical condition, Midazolam, Ondansetron and Dexamethasone  Airway Management Planned: LMA  Additional Equipment: None  Intra-op Plan:   Post-operative Plan:   Informed Consent: I have reviewed the patients History and Physical, chart, labs and discussed the procedure including the risks, benefits and alternatives for the proposed anesthesia with the patient or authorized representative who has indicated his/her understanding and acceptance.     Dental advisory given  Plan Discussed with: CRNA and Surgeon  Anesthesia Plan Comments:        Anesthesia Quick Evaluation

## 2022-08-27 ENCOUNTER — Ambulatory Visit (HOSPITAL_BASED_OUTPATIENT_CLINIC_OR_DEPARTMENT_OTHER): Payer: Medicare Other | Admitting: Anesthesiology

## 2022-08-27 ENCOUNTER — Encounter (HOSPITAL_COMMUNITY)
Admission: RE | Admit: 2022-08-27 | Discharge: 2022-08-27 | Disposition: A | Discharge status: Home / Self Care | Payer: Medicare Other | Source: Ambulatory Visit | Attending: Gynecologic Oncology | Admitting: Gynecologic Oncology

## 2022-08-27 ENCOUNTER — Other Ambulatory Visit: Payer: Self-pay

## 2022-08-27 ENCOUNTER — Encounter (HOSPITAL_BASED_OUTPATIENT_CLINIC_OR_DEPARTMENT_OTHER): Payer: Self-pay | Admitting: Gynecologic Oncology

## 2022-08-27 ENCOUNTER — Encounter (HOSPITAL_BASED_OUTPATIENT_CLINIC_OR_DEPARTMENT_OTHER)
Admission: RE | Disposition: A | Discharge status: Home / Self Care | Payer: Self-pay | Source: Home / Self Care | Attending: Gynecologic Oncology

## 2022-08-27 ENCOUNTER — Ambulatory Visit (HOSPITAL_BASED_OUTPATIENT_CLINIC_OR_DEPARTMENT_OTHER)
Admission: RE | Admit: 2022-08-27 | Discharge: 2022-08-27 | Disposition: A | Discharge status: Home / Self Care | Payer: Medicare Other | Attending: Gynecologic Oncology | Admitting: Gynecologic Oncology

## 2022-08-27 DIAGNOSIS — M1990 Unspecified osteoarthritis, unspecified site: Secondary | ICD-10-CM | POA: Insufficient documentation

## 2022-08-27 DIAGNOSIS — N888 Other specified noninflammatory disorders of cervix uteri: Secondary | ICD-10-CM | POA: Insufficient documentation

## 2022-08-27 DIAGNOSIS — E119 Type 2 diabetes mellitus without complications: Secondary | ICD-10-CM | POA: Insufficient documentation

## 2022-08-27 DIAGNOSIS — Z9049 Acquired absence of other specified parts of digestive tract: Secondary | ICD-10-CM | POA: Diagnosis not present

## 2022-08-27 DIAGNOSIS — N939 Abnormal uterine and vaginal bleeding, unspecified: Secondary | ICD-10-CM | POA: Diagnosis not present

## 2022-08-27 DIAGNOSIS — F319 Bipolar disorder, unspecified: Secondary | ICD-10-CM | POA: Diagnosis not present

## 2022-08-27 DIAGNOSIS — Z01818 Encounter for other preprocedural examination: Secondary | ICD-10-CM

## 2022-08-27 DIAGNOSIS — C801 Malignant (primary) neoplasm, unspecified: Secondary | ICD-10-CM | POA: Insufficient documentation

## 2022-08-27 DIAGNOSIS — C579 Malignant neoplasm of female genital organ, unspecified: Secondary | ICD-10-CM | POA: Diagnosis not present

## 2022-08-27 DIAGNOSIS — C541 Malignant neoplasm of endometrium: Secondary | ICD-10-CM | POA: Insufficient documentation

## 2022-08-27 DIAGNOSIS — Z6841 Body Mass Index (BMI) 40.0 and over, adult: Secondary | ICD-10-CM | POA: Diagnosis not present

## 2022-08-27 DIAGNOSIS — Z7985 Long-term (current) use of injectable non-insulin antidiabetic drugs: Secondary | ICD-10-CM | POA: Diagnosis not present

## 2022-08-27 DIAGNOSIS — F419 Anxiety disorder, unspecified: Secondary | ICD-10-CM | POA: Insufficient documentation

## 2022-08-27 DIAGNOSIS — Z833 Family history of diabetes mellitus: Secondary | ICD-10-CM | POA: Diagnosis not present

## 2022-08-27 DIAGNOSIS — M797 Fibromyalgia: Secondary | ICD-10-CM | POA: Insufficient documentation

## 2022-08-27 DIAGNOSIS — F418 Other specified anxiety disorders: Secondary | ICD-10-CM | POA: Diagnosis not present

## 2022-08-27 DIAGNOSIS — Z87891 Personal history of nicotine dependence: Secondary | ICD-10-CM | POA: Insufficient documentation

## 2022-08-27 HISTORY — DX: Unspecified osteoarthritis, unspecified site: M19.90

## 2022-08-27 HISTORY — PX: CERVICAL CONIZATION W/BX: SHX1330

## 2022-08-27 HISTORY — PX: DILATATION & CURETTAGE/HYSTEROSCOPY WITH MYOSURE: SHX6511

## 2022-08-27 HISTORY — DX: Personal history of other medical treatment: Z92.89

## 2022-08-27 HISTORY — DX: Presence of spectacles and contact lenses: Z97.3

## 2022-08-27 LAB — POCT I-STAT, CHEM 8
BUN: 8 mg/dL (ref 6–20)
Calcium, Ion: 1.17 mmol/L (ref 1.15–1.40)
Chloride: 105 mmol/L (ref 98–111)
Creatinine, Ser: 0.8 mg/dL (ref 0.44–1.00)
Glucose, Bld: 136 mg/dL — ABNORMAL HIGH (ref 70–99)
HCT: 35 % — ABNORMAL LOW (ref 36.0–46.0)
Hemoglobin: 11.9 g/dL — ABNORMAL LOW (ref 12.0–15.0)
Potassium: 4.1 mmol/L (ref 3.5–5.1)
Sodium: 141 mmol/L (ref 135–145)
TCO2: 26 mmol/L (ref 22–32)

## 2022-08-27 LAB — POCT PREGNANCY, URINE: Preg Test, Ur: NEGATIVE

## 2022-08-27 LAB — GLUCOSE, CAPILLARY: Glucose-Capillary: 151 mg/dL — ABNORMAL HIGH (ref 70–99)

## 2022-08-27 SURGERY — EXAM UNDER ANESTHESIA
Anesthesia: General | Site: Vagina

## 2022-08-27 MED ORDER — PROPOFOL 10 MG/ML IV BOLUS
INTRAVENOUS | Status: AC
  Filled 2022-08-27: qty 20

## 2022-08-27 MED ORDER — OXYCODONE HCL 5 MG PO TABS: 5.0000 mg | ORAL_TABLET | Freq: Once | ORAL | Status: DC | PRN

## 2022-08-27 MED ORDER — SCOPOLAMINE 1 MG/3DAYS TD PT72
MEDICATED_PATCH | TRANSDERMAL | Status: AC
  Filled 2022-08-27: qty 1

## 2022-08-27 MED ORDER — ONDANSETRON HCL 4 MG/2ML IJ SOLN
INTRAMUSCULAR | Status: DC | PRN
  Administered 2022-08-27: 4 mg via INTRAVENOUS

## 2022-08-27 MED ORDER — SCOPOLAMINE 1 MG/3DAYS TD PT72
1.0000 | MEDICATED_PATCH | TRANSDERMAL | Status: DC
  Administered 2022-08-27: 1.5 mg via TRANSDERMAL

## 2022-08-27 MED ORDER — ACETAMINOPHEN 500 MG PO TABS
1000.0000 mg | ORAL_TABLET | ORAL | Status: AC
  Administered 2022-08-27: 1000 mg via ORAL

## 2022-08-27 MED ORDER — PROPOFOL 10 MG/ML IV BOLUS
INTRAVENOUS | Status: DC | PRN
  Administered 2022-08-27: 200 mg via INTRAVENOUS
  Administered 2022-08-27: 40 mg via INTRAVENOUS

## 2022-08-27 MED ORDER — MIDAZOLAM HCL 2 MG/2ML IJ SOLN
INTRAMUSCULAR | Status: AC
  Filled 2022-08-27: qty 2

## 2022-08-27 MED ORDER — EPHEDRINE SULFATE (PRESSORS) 50 MG/ML IJ SOLN
INTRAMUSCULAR | Status: DC | PRN
  Administered 2022-08-27 (×3): 10 mg via INTRAVENOUS

## 2022-08-27 MED ORDER — LIDOCAINE HCL (CARDIAC) PF 100 MG/5ML IV SOSY
PREFILLED_SYRINGE | INTRAVENOUS | Status: DC | PRN
  Administered 2022-08-27: 50 mg via INTRAVENOUS

## 2022-08-27 MED ORDER — ONDANSETRON HCL 4 MG/2ML IJ SOLN: 4.0000 mg | Freq: Once | INTRAMUSCULAR | Status: DC | PRN

## 2022-08-27 MED ORDER — FENTANYL CITRATE (PF) 100 MCG/2ML IJ SOLN
INTRAMUSCULAR | Status: DC | PRN
  Administered 2022-08-27 (×2): 50 ug via INTRAVENOUS

## 2022-08-27 MED ORDER — DEXAMETHASONE SODIUM PHOSPHATE 4 MG/ML IJ SOLN
INTRAMUSCULAR | Status: DC | PRN
  Administered 2022-08-27: 4 mg via INTRAVENOUS

## 2022-08-27 MED ORDER — LIDOCAINE HCL 1 % IJ SOLN
INTRAMUSCULAR | Status: DC | PRN
  Administered 2022-08-27: 10 mL

## 2022-08-27 MED ORDER — MIDAZOLAM HCL 2 MG/2ML IJ SOLN
INTRAMUSCULAR | Status: DC | PRN
  Administered 2022-08-27: 2 mg via INTRAVENOUS

## 2022-08-27 MED ORDER — KETOROLAC TROMETHAMINE 30 MG/ML IJ SOLN: 30.0000 mg | Freq: Once | INTRAMUSCULAR | Status: DC | PRN

## 2022-08-27 MED ORDER — LACTATED RINGERS IV SOLN: INTRAVENOUS | Status: DC

## 2022-08-27 MED ORDER — GLYCOPYRROLATE 0.2 MG/ML IJ SOLN
INTRAMUSCULAR | Status: DC | PRN
  Administered 2022-08-27 (×2): .1 mg via INTRAVENOUS

## 2022-08-27 MED ORDER — SODIUM CHLORIDE 0.9 % IR SOLN
Status: DC | PRN
  Administered 2022-08-27 (×2): 3000 mL

## 2022-08-27 MED ORDER — FLUDEOXYGLUCOSE F - 18 (FDG) INJECTION
14.4200 | Freq: Once | INTRAVENOUS | Status: AC | PRN
  Administered 2022-08-27: 14.42 via INTRAVENOUS

## 2022-08-27 MED ORDER — OXYCODONE HCL 5 MG/5ML PO SOLN: 5.0000 mg | Freq: Once | ORAL | Status: DC | PRN

## 2022-08-27 MED ORDER — DEXAMETHASONE SODIUM PHOSPHATE 10 MG/ML IJ SOLN: 4.0000 mg | INTRAMUSCULAR | Status: DC

## 2022-08-27 MED ORDER — DEXMEDETOMIDINE HCL IN NACL 400 MCG/100ML IV SOLN
INTRAVENOUS | Status: DC | PRN
  Administered 2022-08-27: 8 ug via INTRAVENOUS
  Administered 2022-08-27: 2 ug via INTRAVENOUS

## 2022-08-27 MED ORDER — FENTANYL CITRATE (PF) 100 MCG/2ML IJ SOLN
INTRAMUSCULAR | Status: AC
  Filled 2022-08-27: qty 2

## 2022-08-27 MED ORDER — HYDROMORPHONE HCL 1 MG/ML IJ SOLN: 0.2500 mg | INTRAMUSCULAR | Status: DC | PRN

## 2022-08-27 MED ORDER — KETOROLAC TROMETHAMINE 15 MG/ML IJ SOLN: 15.0000 mg | INTRAMUSCULAR | Status: DC

## 2022-08-27 MED ORDER — ACETAMINOPHEN 500 MG PO TABS
ORAL_TABLET | ORAL | Status: AC
  Filled 2022-08-27: qty 2

## 2022-08-27 MED ORDER — KETOROLAC TROMETHAMINE 30 MG/ML IJ SOLN
INTRAMUSCULAR | Status: DC | PRN
  Administered 2022-08-27: 15 mg via INTRAVENOUS

## 2022-08-27 SURGICAL SUPPLY — 38 items
APL SWBSTK 6 STRL LF DISP (MISCELLANEOUS) ×3
APPLICATOR COTTON TIP 6 STRL (MISCELLANEOUS) ×3 IMPLANT
APPLICATOR COTTON TIP 6IN STRL (MISCELLANEOUS) ×3
BLADE EXTENDED COATED 6.5IN (ELECTRODE) ×3 IMPLANT
BLADE SURG 11 STRL SS (BLADE) ×3 IMPLANT
BLADE SURG 15 STRL LF DISP TIS (BLADE) IMPLANT
BLADE SURG 15 STRL SS (BLADE)
CATH ROBINSON RED A/P 16FR (CATHETERS) IMPLANT
DEVICE MYOSURE LITE (MISCELLANEOUS) IMPLANT
DEVICE MYOSURE REACH (MISCELLANEOUS) IMPLANT
DILATOR CANAL MILEX (MISCELLANEOUS) IMPLANT
DRSG TELFA 3X8 NADH STRL (GAUZE/BANDAGES/DRESSINGS) ×3 IMPLANT
ELECT BALL LEEP 5MM RED (ELECTRODE) IMPLANT
GAUZE 4X4 16PLY ~~LOC~~+RFID DBL (SPONGE) ×6 IMPLANT
GLOVE BIO SURGEON STRL SZ 6 (GLOVE) ×6 IMPLANT
GLOVE BIO SURGEON STRL SZ 6.5 (GLOVE) IMPLANT
GOWN STRL REUS W/TWL LRG LVL3 (GOWN DISPOSABLE) ×3 IMPLANT
HEMOSTAT SURGICEL 4X8 (HEMOSTASIS) ×3 IMPLANT
IV NS IRRIG 3000ML ARTHROMATIC (IV SOLUTION) ×3 IMPLANT
KIT TURNOVER CYSTO (KITS) ×3 IMPLANT
NDL HYPO 25X1 1.5 SAFETY (NEEDLE) ×3 IMPLANT
NEEDLE HYPO 25X1 1.5 SAFETY (NEEDLE) ×3 IMPLANT
NS IRRIG 500ML POUR BTL (IV SOLUTION) ×3 IMPLANT
PACK PERINEAL COLD (PAD) ×3 IMPLANT
PACK VAGINAL WOMENS (CUSTOM PROCEDURE TRAY) ×3 IMPLANT
PAD OB MATERNITY 4.3X12.25 (PERSONAL CARE ITEMS) ×3 IMPLANT
PAD PREP 24X48 CUFFED NSTRL (MISCELLANEOUS) ×3 IMPLANT
SEAL ROD LENS SCOPE MYOSURE (ABLATOR) ×3 IMPLANT
SPONGE SURGIFOAM ABS GEL 12-7 (HEMOSTASIS) IMPLANT
SUT VIC AB 0 CT1 36 (SUTURE) ×6 IMPLANT
SUT VIC AB 2-0 SH 27 (SUTURE)
SUT VIC AB 2-0 SH 27XBRD (SUTURE) IMPLANT
SUT VIC AB 2-0 UR6 27 (SUTURE) IMPLANT
SWAB OB GYN 8IN STERILE 2PK (MISCELLANEOUS) ×3 IMPLANT
SYR BULB IRRIG 60ML STRL (SYRINGE) ×3 IMPLANT
TOWEL OR 17X26 10 PK STRL BLUE (TOWEL DISPOSABLE) ×6 IMPLANT
TUBE CONNECTING 12X1/4 (SUCTIONS) ×3 IMPLANT
WATER STERILE IRR 500ML POUR (IV SOLUTION) ×3 IMPLANT

## 2022-08-27 NOTE — Transfer of Care (Signed)
Immediate Anesthesia Transfer of Care Note  Patient: Megan Haas  Procedure(s) Performed: EXAM UNDER ANESTHESIA (Vagina ) DILATATION & CURETTAGE/HYSTEROSCOPY WITH MYOSURE (Uterus) CERVIAL BIOPSIES AND ENDOCERVICAL CURRETTAGE (Cervix)  Patient Location: PACU  Anesthesia Type:General  Level of Consciousness: awake, alert , oriented, and patient cooperative  Airway & Oxygen Therapy: Patient Spontanous Breathing and Patient connected to nasal cannula oxygen  Post-op Assessment: Report given to RN and Post -op Vital signs reviewed and stable  Post vital signs: Reviewed and stable  Last Vitals:  Vitals Value Taken Time  BP    Temp    Pulse 76 08/27/22 1553  Resp 12 08/27/22 1553  SpO2 92 % 08/27/22 1553  Vitals shown include unvalidated device data.  Last Pain:  Vitals:   08/27/22 1247  TempSrc: Oral  PainSc: 0-No pain      Patients Stated Pain Goal: 4 (09/06/13 5208)  Complications: No notable events documented.

## 2022-08-27 NOTE — Anesthesia Procedure Notes (Signed)
Procedure Name: LMA Insertion Date/Time: 08/27/2022 3:09 PM  Performed by: Georgeanne Nim, CRNAPre-anesthesia Checklist: Patient identified, Emergency Drugs available, Suction available, Patient being monitored and Timeout performed Patient Re-evaluated:Patient Re-evaluated prior to induction Oxygen Delivery Method: Circle system utilized Preoxygenation: Pre-oxygenation with 100% oxygen Induction Type: IV induction Ventilation: Mask ventilation without difficulty LMA: LMA with gastric port inserted LMA Size: 4.0 Number of attempts: 1 Airway Equipment and Method: Stylet Placement Confirmation: positive ETCO2 and breath sounds checked- equal and bilateral Tube secured with: Tape Dental Injury: Teeth and Oropharynx as per pre-operative assessment

## 2022-08-27 NOTE — Interval H&P Note (Signed)
History and Physical Interval Note:  08/27/2022 12:24 PM  Megan Haas  has presented today for surgery, with the diagnosis of ADNOCARCINOMA OF GYN ORIGIN.  The various methods of treatment have been discussed with the patient and family. After consideration of risks, benefits and other options for treatment, the patient has consented to  Procedure(s): EXAM UNDER ANESTHESIA (N/A) DILATATION & CURETTAGE/HYSTEROSCOPY WITH MYOSURE (N/A) CONIZATION CERVIX WITH BIOPSY,ENDOCERVICAL CURRETTAGE (N/A) as a surgical intervention.  The patient's history has been reviewed, patient examined, no change in status, stable for surgery.  I have reviewed the patient's chart and labs.  Questions were answered to the patient's satisfaction.     [name]

## 2022-08-27 NOTE — Discharge Instructions (Addendum)
AFTER SURGERY INSTRUCTIONS   Return to work:  Variable based on occupation   Activity: 1. Be up and out of the bed during the day.  Take a nap if needed.  You may walk up steps but be careful and use the hand rail.  Stair climbing will tire you more than you think, you may need to stop part way and rest.    2. No lifting or straining for 2 weeks minimum over 10 pounds. No pushing, pulling, straining for 2 weeks.   3. No driving for minimum 24 hours after surgery.  Do not drive if you are taking narcotic pain medicine and make sure that your reaction time has returned.    4. You can shower as soon as the next day after surgery. Shower daily. No tub baths or submerging your body in water until cleared by your surgeon. If you have the soap that was given to you by pre-surgical testing that was used before surgery, you do not need to use it afterwards because this can irritate your incisions.    5. No sexual activity and nothing in the vagina for 4 weeks.   6. You may experience vaginal spotting and discharge after surgery.  The spotting is normal but if you experience heavy bleeding, call our office.   7. Take Tylenol or ibuprofen first for pain if you are able to take these medications and only use narcotic pain medication for severe pain not relieved by the Tylenol or Ibuprofen.  Monitor your Tylenol intake to a max of 4,000 mg in a 24 hour period. You can alternate these medications after surgery.   Diet: 1. Low sodium Heart Healthy Diet is recommended but you are cleared to resume your normal (before surgery) diet after your procedure.   2. It is safe to use a laxative, such as Miralax or Colace, if you have difficulty moving your bowels. You have been prescribed Sennakot at bedtime every evening to keep bowel movements regular and to prevent constipation.     Wound Care: 1. Keep clean and dry.  Shower daily.   Reasons to call the Doctor: Fever - Oral temperature greater than 100.4  degrees Fahrenheit Foul-smelling vaginal discharge Difficulty urinating Nausea and vomiting Increased pain at the site of the incision that is unrelieved with pain medicine. Difficulty breathing with or without chest pain New calf pain especially if only on one side Sudden, continuing increased vaginal bleeding with or without clots.   Contacts: For questions or concerns you should contact:   Dr. Jeral Pinch at [phone]   Joylene John, NP at [phone]   After Hours: call [phone] and have the GYN Oncologist paged/contacted (after 5 pm or on the weekends).   Messages sent via mychart are for non-urgent matters and are not responded to after hours so for urgent needs, please call the after hours number.  No NSAID's until 9:30 p.m.

## 2022-08-27 NOTE — Op Note (Signed)
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  Surgery: Hysteroscopy with endometrial curettings, cervical biopsies, endocervical curettage  Surgeons:  Valarie Cones, MD  Assistant: Joylene John NP  Anesthesia: General   Estimated blood loss: 50 ml  IVF:  see I&O flowsheet   Fluid deficit from hysteroscopy: 100 cc  Urine output: n/a   Complications: None apparent  Pathology: endometrial curetting, prolapsing endometrial versus endocervical polyp/tumor, cervical biopsies at 2 and 9:00, endocervical curettage  Operative findings: On EUA, cervix dilated approximately 2 cm with polypoid tissue prolapsing through the cervix, cervix itself is normal, not firm or nodular.  Nabothian cyst along the posterior aspect of the base of the cervix between 4 and 6:00.  On rectovaginal exam, no definitive parametrial nodularity or thickening noted.  On speculum exam, polypoid appearing lesion prolapsing through the cervix which is visually dilated.  After this is removed, the canal of the cervix itself is normal in appearance and smooth.  On hysteroscopy, polypoid tissue at the fundus along the left aspect and anteriorly.  Thickened, hypervascular tissue obscuring the right aspect of the uterus.  Only the left ostia visualized.  Polypoid lesions noted to be prolapsing into the upper endocervix, all removed. 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  Given desire to avoid unnecessary procedures and prolonged time to definitive surgery, cold knife cone was not pursued.  Procedure: The patient was identified in the preoperative holding area. Informed consent was signed on the chart. Patient was seen history was reviewed and exam was performed.   The patient was then taken to the operating  room and placed in the supine position with SCD hose on. General anesthesia was then induced without difficulty. She was then placed in the dorsolithotomy position. The perineum was prepped with Betadine. The vagina was prepped with Betadine. The patient was then draped after the prep was dried.   Timeout was performed the patient, procedure, antibiotic, allergy, and length of procedure.   The speculum was placed in the vagina. The single tooth tenaculum was placed on the anterior lip of the cervix.  10 cc of 1% lidocaine was injected as a paracervical block at 4 and 8:00 at the cervicovaginal junction.  Ring forceps were then used to remove the prolapsing polypoid tissue within the cervical canal.  Cervix was already noted to be dilated approximately 2 cm.  The MyoSure hysteroscope was inserted and the endocervix and uterine cavity were visualized with findings noted above.  The MyoSure reach was then used to remove polypoid and prolapsing lesions as well as some of the thickened and abnormal appearing endometrium along the right aspect of the uterus.  Specimen was collected in the MyoSure specimen sock.  The hysteroscope was then removed.  Endocervical curettage was performed and sent as a separate specimen.  Cervical biopsies were taken with Tischler forceps from 2 and 9:00 on the face of the cervix.  Monopolar electrocautery was used to achieve stasis of the cervical biopsy sites.  Tenaculum was removed with good hemostasis noted.  Vagina was irrigated copiously.  All instrument, suture, laparotomy, Ray-Tec, and needle counts were correct x2. The patient tolerated the procedure well and was taken recovery room in stable condition.   [name], MD

## 2022-08-28 ENCOUNTER — Encounter (HOSPITAL_BASED_OUTPATIENT_CLINIC_OR_DEPARTMENT_OTHER): Payer: Self-pay | Admitting: Gynecologic Oncology

## 2022-08-28 ENCOUNTER — Telehealth: Payer: Self-pay | Admitting: *Deleted

## 2022-08-28 ENCOUNTER — Telehealth: Payer: Self-pay | Admitting: Gynecologic Oncology

## 2022-08-28 NOTE — Anesthesia Postprocedure Evaluation (Signed)
Anesthesia Post Note  Patient: Megan Haas  Procedure(s) Performed: EXAM UNDER ANESTHESIA (Vagina ) DILATATION & CURETTAGE/HYSTEROSCOPY WITH MYOSURE (Uterus) CERVIAL BIOPSIES AND ENDOCERVICAL CURRETTAGE (Cervix)     Patient location during evaluation: PACU Anesthesia Type: General Level of consciousness: awake and alert Pain management: pain level controlled Vital Signs Assessment: post-procedure vital signs reviewed and stable Respiratory status: spontaneous breathing, nonlabored ventilation, respiratory function stable and patient connected to nasal cannula oxygen Cardiovascular status: blood pressure returned to baseline and stable Postop Assessment: no apparent nausea or vomiting Anesthetic complications: no  No notable events documented.  Last Vitals:  Vitals:   08/27/22 1630 08/27/22 1700  BP: (!) 142/90 116/65  Pulse: (!) 57 65  Resp: (!) 8 14  Temp: 36.7 C 36.7 C  SpO2: 94% 94%    Last Pain:  Vitals:   08/28/22 1053  TempSrc:   PainSc: 2                  Barnet Glasgow

## 2022-08-28 NOTE — Telephone Encounter (Signed)
Called the patient.  Discussed PET scan results as well as findings from surgery.  I suspect that pathology will confirm that this is uterine cancer.  I think it is very unlikely that the mildly avid mesenteric node is related.  Also discussed nonspecific findings in the right groin node and pulmonary nodules.  These will be findings that we follow closely on imaging.  Jeral Pinch MD Gynecologic Oncology

## 2022-08-28 NOTE — Telephone Encounter (Signed)
Call to patient for post-op follow-up. Left message to call back.

## 2022-08-28 NOTE — Telephone Encounter (Signed)
Spoke to patient  this morning. She states she is eating, drinking and urinating well. She has not had a BM yet but is passing gas. She is taking senokot as prescribed and encouraged her to drink plenty of water. She denies fever or chills.  She rates her pain 2/10. Her pain is controlled with Tylenol.    Instructed to call office with any fever, chills, purulent drainage, uncontrolled pain or any other questions or concerns. Patient verbalizes understanding.   Pt aware of post op appointments as well as the office number [phone] and after hours number [phone] to call if she has any questions or concerns

## 2022-08-31 ENCOUNTER — Encounter: Payer: Self-pay | Admitting: Gynecologic Oncology

## 2022-08-31 LAB — SURGICAL PATHOLOGY

## 2022-08-31 NOTE — Progress Notes (Signed)
Called the patient. Reviewed results from surgery last week which confirmed that this is an endometrial cancer. Discussed these findings in the setting of her pet scan. Recommend that we proceed with definitive surgery with robotic hysterectomy/BSO (discussed BS vs BSO), bilateral sentinel lymph node biopsy. We discussed briefly this procedure today. I will have my office reach out regarding date for surgery. All questions answered.< BR> Valarie Cones MD

## 2022-09-01 ENCOUNTER — Telehealth: Payer: Self-pay | Admitting: *Deleted

## 2022-09-01 ENCOUNTER — Other Ambulatory Visit: Payer: Self-pay | Admitting: Gynecologic Oncology

## 2022-09-01 DIAGNOSIS — C541 Malignant neoplasm of endometrium: Secondary | ICD-10-CM

## 2022-09-01 NOTE — Telephone Encounter (Addendum)
Patient's husband and patient called to receive a surgery date. Also they ask about using an abdominal binder after surgery; it was recommended by a friend. Explained that [place] [facility] is working on dates today and the office will call them back.

## 2022-09-01 NOTE — Telephone Encounter (Signed)
Pt has chosen February 15 for her surgery. Pt is aware, per Joylene John NP, to stop her Tumeric now and hold the Ozempic for 1 week prior to surgery date. She voiced an understanding.

## 2022-09-02 NOTE — Interval H&P Note (Signed)
History and Physical Interval Note:  09/02/2022 6:37 PM  Megan Haas  has presented today for surgery, with the diagnosis of ADNOCARCINOMA OF GYN ORIGIN.  The various methods of treatment have been discussed with the patient and family. After consideration of risks, benefits and other options for treatment, the patient has consented to  Procedure(s): EXAM UNDER ANESTHESIA (N/A) DILATATION & CURETTAGE/HYSTEROSCOPY WITH MYOSURE (N/A) CERVIAL BIOPSIES AND ENDOCERVICAL CURRETTAGE (N/A) as a surgical intervention.  The patient's history has been reviewed, patient examined, no change in status, stable for surgery.  I have reviewed the patient's chart and labs.  Questions were answered to the patient's satisfaction.     [name]

## 2022-09-03 ENCOUNTER — Telehealth: Payer: Medicare Other | Admitting: Gynecologic Oncology

## 2022-09-03 NOTE — Patient Instructions (Signed)
DUE TO COVID-19 ONLY TWO VISITORS  (aged 49 and older)  ARE ALLOWED TO COME WITH YOU AND STAY IN THE WAITING ROOM ONLY DURING PRE OP AND PROCEDURE.   **NO VISITORS ARE ALLOWED IN THE SHORT STAY AREA OR RECOVERY ROOM!!**  IF YOU WILL BE ADMITTED INTO THE HOSPITAL YOU ARE ALLOWED ONLY FOUR SUPPORT PEOPLE DURING VISITATION HOURS ONLY (7 AM -8PM)   The support person(s) must pass our screening, gel in and out, and wear a mask at all times, including in the patient's room. Patients must also wear a mask when staff or their support person are in the room. Visitors GUEST BADGE MUST BE WORN VISIBLY  One adult visitor may remain with you overnight and MUST be in the room by 8 P.M.     Your procedure is scheduled on: 09/10/22   Report to [facility] Main Entrance    Report to admitting at 9:15  AM   Call this number if you have problems the morning of surgery [phone]   After Midnight. On 2/14 start a light diet.  Light Diet- Full liquid diet  Strained creamy soups Tea, Coffee- with cream or mild and sugar or honey  Juices- cranberry , grape and apple  Jello  Milkshakes  Pudding , custards  Popsicles  Water Plain ice cream f, frozen yogurt, sherbet, plain yogurt  Fruit ices and popsicles with no fruit pulp  Sugar, honey and syrups Clear broths  Boost, Ensure, Resource and other liquid supplements NO CARBONATED BEVERAGES    After Midnight you may have the following liquids until _8:30__AM/  DAY OF SURGERY  Water Black Coffee (sugar ok, NO MILK/CREAM OR CREAMERS)  Tea (sugar ok, NO MILK/CREAM OR CREAMERS) regular and decaf                             Plain Jell-O (NO RED)                                           Fruit ices (not with fruit pulp, NO RED)                                     Popsicles (NO RED)                                                                  Juice: apple, WHITE grape, WHITE cranberry Sports drinks like Gatorade (NO RED)                       If you have questions, please contact your surgeon's office.      Oral Hygiene is also important to reduce your risk of infection.                                    Remember - BRUSH YOUR TEETH THE MORNING OF SURGERY WITH YOUR REGULAR TOOTHPASTE  DENTURES WILL BE REMOVED PRIOR TO SURGERY  PLEASE DO NOT APPLY "Poly grip" OR ADHESIVES!!!   Do NOT smoke after Midnight   Take these medicines the morning of surgery with A SIP OF WATER: Gabapentin                                                                                                                           Bupropion- Wellbutrin                                                                                                                            Brexpiprazole-Rexulti  DO NOT TAKE ANY ORAL DIABETIC MEDICATIONS DAY OF YOUR SURGERY  Do not take Ozempic for 7 days prior to day of surgery. Last dose was:   Bring CPAP mask and tubing day of surgery.                              You may not have any metal on your body including hair pins, jewelry, and body piercing             Do not wear make-up, lotions, powders, perfumes/cologne, or deodorant  Do not wear nail polish including gel and S&S, artificial/acrylic nails, or any other type of covering on natural nails including finger and toenails. If you have artificial nails, gel coating, etc. that needs to be removed by a nail salon please have this removed prior to surgery or surgery may need to be canceled/ delayed if the surgeon/ anesthesia feels like they are unable to be safely monitored.   Do not shave  48 hours prior to surgery.     Do not bring valuables to the hospital. [place].   Contacts, glasses, or bridgework may not be worn into surgery.   Bring small overnight bag day of surgery.   DO NOT [place]. PHARMACY WILL DISPENSE MEDICATIONS LISTED ON YOUR MEDICATION LIST TO YOU DURING  YOUR ADMISSION [place]!    Patients discharged on the day of surgery will not be allowed to drive home.  Someone NEEDS to stay with you for the first 24 hours after anesthesia.   Special Instructions: Bring a copy of your healthcare power of attorney and living will documents  the day of surgery if you  haven't scanned them before.              Please read over the following fact sheets you were given: IF YOU HAVE QUESTIONS ABOUT YOUR PRE-OP INSTRUCTIONS PLEASE CALL [phone]    [facility] - Preparing for Surgery Before surgery, you can play an important role.  Because skin is not sterile, your skin needs to be as free of germs as possible.  You can reduce the number of germs on your skin by washing with CHG (chlorahexidine gluconate) soap before surgery.  CHG is an antiseptic cleaner which kills germs and bonds with the skin to continue killing germs even after washing. Please DO NOT use if you have an allergy to CHG or antibacterial soaps.  If your skin becomes reddened/irritated stop using the CHG and inform your nurse when you arrive at Short Stay. Do not shave (including legs and underarms) for at least 48 hours prior to the first CHG shower.   Please follow these instructions carefully:  1.  Shower with CHG Soap the night before surgery and the  morning of Surgery.  2.  If you choose to wash your hair, wash your hair first as usual with your  normal  shampoo.  3.  After you shampoo, rinse your hair and body thoroughly to remove the  shampoo.                            4.  Use CHG as you would any other liquid soap.  You can apply chg directly  to the skin and wash                       Gently with a scrungie or clean washcloth.  5.  Apply the CHG Soap to your body ONLY FROM THE NECK DOWN.   Do not use on face/ open                           Wound or open sores. Avoid contact with eyes, ears mouth and genitals (private parts).                       Wash face,  Genitals (private  parts) with your normal soap.             6.  Wash thoroughly, paying special attention to the area where your surgery  will be performed.  7.  Thoroughly rinse your body with warm water from the neck down.  8.  DO NOT shower/wash with your normal soap after using and rinsing off  the CHG Soap.             9.  Pat yourself dry with a clean towel.            10.  Wear clean pajamas.            11.  Place clean sheets on your bed the night of your first shower and do not  sleep with pets. Day of Surgery : Do not apply any lotions/deodorants the morning of surgery.  Please wear clean clothes to the hospital/surgery center.    ________________________________________________________________________  Incentive Spirometer  An incentive spirometer is a tool that can help keep your lungs clear and active. This tool measures how well you are filling your lungs with each breath. Taking long deep breaths may help reverse or  decrease the chance of developing breathing (pulmonary) problems (especially infection) following: A long period of time when you are unable to move or be active. BEFORE THE PROCEDURE  If the spirometer includes an indicator to show your best effort, your nurse or respiratory therapist will set it to a desired goal. If possible, sit up straight or lean slightly forward. Try not to slouch. Hold the incentive spirometer in an upright position. INSTRUCTIONS FOR USE  Sit on the edge of your bed if possible, or sit up as far as you can in bed or on a chair. Hold the incentive spirometer in an upright position. Breathe out normally. Place the mouthpiece in your mouth and seal your lips tightly around it. Breathe in slowly and as deeply as possible, raising the piston or the ball toward the top of the column. Hold your breath for 3-5 seconds or for as long as possible. Allow the piston or ball to fall to the bottom of the column. Remove the mouthpiece from your mouth and breathe out  normally. Rest for a few seconds and repeat Steps 1 through 7 at least 10 times every 1-2 hours when you are awake. Take your time and take a few normal breaths between deep breaths. The spirometer may include an indicator to show your best effort. Use the indicator as a goal to work toward during each repetition. After each set of 10 deep breaths, practice coughing to be sure your lungs are clear. If you have an incision (the cut made at the time of surgery), support your incision when coughing by placing a pillow or rolled up towels firmly against it. Once you are able to get out of bed, walk around indoors and cough well. You may stop using the incentive spirometer when instructed by your caregiver.  RISKS AND COMPLICATIONS Take your time so you do not get dizzy or light-headed. If you are in pain, you may need to take or ask for pain medication before doing incentive spirometry. It is harder to take a deep breath if you are having pain. AFTER USE Rest and breathe slowly and easily. It can be helpful to keep track of a log of your progress. Your caregiver can provide you with a simple table to help with this. If you are using the spirometer at home, follow these instructions: [place] IF:  You are having difficultly using the spirometer. You have trouble using the spirometer as often as instructed. Your pain medication is not giving enough relief while using the spirometer. You develop fever of 100.5 F (38.1 C) or higher. SEEK IMMEDIATE MEDICAL CARE IF:  You cough up bloody sputum that had not been present before. You develop fever of 102 F (38.9 C) or greater. You develop worsening pain at or near the incision site. MAKE SURE YOU:  Understand these instructions. Will watch your condition. Will get help right away if you are not doing well or get worse. Document Released: 11/23/2006 Document Revised: 10/05/2011 Document Reviewed: 01/24/2007 [facility] Patient Information  2014 [place], [place].   ________________________________________________________________________

## 2022-09-04 ENCOUNTER — Inpatient Hospital Stay: Payer: Medicare Other | Attending: Gynecologic Oncology | Admitting: Gynecologic Oncology

## 2022-09-04 ENCOUNTER — Other Ambulatory Visit: Payer: Self-pay

## 2022-09-04 ENCOUNTER — Encounter: Payer: Self-pay | Admitting: Gynecologic Oncology

## 2022-09-04 ENCOUNTER — Inpatient Hospital Stay (HOSPITAL_BASED_OUTPATIENT_CLINIC_OR_DEPARTMENT_OTHER): Payer: Medicare Other | Admitting: Gynecologic Oncology

## 2022-09-04 VITALS — BP 124/55 | HR 77 | Temp 98.7°F | Resp 16 | Ht 65.0 in | Wt 293.4 lb

## 2022-09-04 DIAGNOSIS — R918 Other nonspecific abnormal finding of lung field: Secondary | ICD-10-CM | POA: Insufficient documentation

## 2022-09-04 DIAGNOSIS — R9389 Abnormal findings on diagnostic imaging of other specified body structures: Secondary | ICD-10-CM | POA: Diagnosis not present

## 2022-09-04 DIAGNOSIS — N939 Abnormal uterine and vaginal bleeding, unspecified: Secondary | ICD-10-CM

## 2022-09-04 DIAGNOSIS — E1169 Type 2 diabetes mellitus with other specified complication: Secondary | ICD-10-CM

## 2022-09-04 DIAGNOSIS — Z6841 Body Mass Index (BMI) 40.0 and over, adult: Secondary | ICD-10-CM | POA: Diagnosis not present

## 2022-09-04 DIAGNOSIS — C541 Malignant neoplasm of endometrium: Secondary | ICD-10-CM

## 2022-09-04 DIAGNOSIS — Z9884 Bariatric surgery status: Secondary | ICD-10-CM

## 2022-09-04 DIAGNOSIS — F419 Anxiety disorder, unspecified: Secondary | ICD-10-CM | POA: Insufficient documentation

## 2022-09-04 DIAGNOSIS — E669 Obesity, unspecified: Secondary | ICD-10-CM

## 2022-09-04 DIAGNOSIS — E66813 Obesity, class 3: Secondary | ICD-10-CM

## 2022-09-04 MED ORDER — TRAMADOL HCL 50 MG PO TABS: 50.0000 mg | ORAL_TABLET | Freq: Four times a day (QID) | ORAL | 0 refills | Status: DC | PRN

## 2022-09-04 MED ORDER — SENNOSIDES-DOCUSATE SODIUM 8.6-50 MG PO TABS: 2.0000 | ORAL_TABLET | Freq: Every day | ORAL | 0 refills | Status: DC

## 2022-09-04 NOTE — H&P (View-Only) (Signed)
Gynecologic Oncology Return Clinic Visit  09/04/22  Reason for Visit: follow-up, treatment planning  Treatment History: The patient endorses a longstanding history of irregular menses.  She describes this as not being sure when her menses will start, often skipping 1 or 2 months.  Her bleeding may be light or heavy.  Menses typically last 4-5 days.  A month before she saw her OB/GYN, she had bleeding on and off for 3 weeks.  She endorses about a 4 to 29-monthhistory of occasional abdominal pain, sometimes upper abdominal pain and sometimes lower abdominal pain.   She was seen by her OB/GYN earlier this month.  At the time of that visit, 2 or 3 larger cervical polyps were seen on exam, partially evulsed. Cervical polyp biopsy: 07/30/2022 reveals well-differentiated invasive adenocarcinoma.  IHC positive for p16, ER and PR.  Negative for p53.  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  08/27/22: Hysteroscopy with endometrial curettings, cervical biopsies, endocervical curettage.  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  This is seen in both the prolapsing endometrial polyp as well as the endometrial curettings.  Cervical biopsies show benign endocervical mucosa, no dysplasia or malignancy.  Endocervical curettage also negative for dysplasia or malignancy.  08/27/22: PET 1. Increased radiotracer uptake localizing to the endometrium compatible with known endometrial adenocarcinoma. 2. No signs of tracer avid retroperitoneal or pelvic lymph nodes. 3. 1.6 cm FDG avid central mesenteric lymph node is identified has an SUV max of 6.38. This is of uncertain clinical significance, and in the absence of retroperitoneal or pelvic adenopathy this would be an unusual location for nodal metastasis. Close interval follow-up is advised. 4. There is a borderline  enlarged right inguinal lymph node with mild tracer uptake. This is nonspecific and may be reactive. 5. Two small nonspecific pulmonary nodules are identified measuring up to 5 mm. These are technically too small to characterize by PET-CT. Attention in these nodules on future surveillance imaging is recommended. 6. Hepatic steatosis.  Interval History: Doing well.  Still having some bleeding after surgery but less than a normal period now.  Denies any significant abdominal or pelvic pain.  Past Medical/Surgical History: Past Medical History:  Diagnosis Date   Adenocarcinoma ([place] 07/2022   cervix   Anemia    Follows w/ Hematology, KRichrd Sox NP @ [facility].S/p 2 iron infusions in 2023.   Anxiety    Arthritis    back   Bipolar 1 disorder ([place]    [place].   COVID-19 07/2020   Depression    Diabetes mellitus without complication ([place]    Type 2. Pt is taking Ozempic & follows w/ [facility].   Fibromyalgia    Follows w/ PCP, KBurr Medico NP @ [facility].   History of sleep study    Pt stated she had a sleep study done in 2023 and was told she does not have sleep apnea.   Obesity, Class III, BMI 40-49.9 (morbid obesity) ([place]    Follows with [facility].   PTSD (post-traumatic stress disorder)    hx of   Wears glasses    for distance    Past Surgical History:  Procedure Laterality Date   CERVICAL CONIZATION W/BX N/A 08/27/2022   Procedure: CERVIAL BIOPSIES AND ENDOCERVICAL CURRETTAGE;  Surgeon: TLafonda Mosses MD;  Location: [facility];  Service: Gynecology;  Laterality: N/A;   CHOLECYSTECTOMY  2006   COLONOSCOPY W/ POLYPECTOMY  03/26/2021   multiple colon polyps   DILATATION & CURETTAGE/HYSTEROSCOPY WITH MYOSURE N/A 08/27/2022   Procedure: DILATATION & CURETTAGE/HYSTEROSCOPY WITH MYOSURE;  Surgeon: [name], MD;  Location: [facility];  Service: Gynecology;  Laterality: N/A;   ESOPHAGOGASTRODUODENOSCOPY  08/18/2018   GASTROPLASTY DUODENAL SWITCH  01/2019   robotically    Family History  Problem Relation Age of Onset   Diabetes Father    Prostate cancer Father    Hypertension Maternal Aunt    Diabetes Maternal Uncle    Hypertension Maternal Grandmother    Diabetes Paternal Grandmother    Colon cancer Neg Hx    Stomach cancer Neg Hx    Pancreatic cancer Neg Hx    Esophageal cancer Neg Hx    Breast cancer Neg Hx    Ovarian cancer Neg Hx    Endometrial cancer Neg Hx     Social History   Socioeconomic History   Marital status: Married    Spouse name: Not on file   Number of children: Not on file   Years of education: Not on file   Highest education level: Not on file  Occupational History   Not on file  Tobacco Use   Smoking status: Former    Packs/day: 0.75    Years: 10.00    Total pack years: 7.50    Types: Cigarettes    Quit date: 2004    Years since quitting: 20.1   Smokeless tobacco: Never  Vaping Use   Vaping Use: Never used  Substance and Sexual Activity   Alcohol use: Yes    Alcohol/week: 1.0 standard drink of alcohol    Types: 1 Standard drinks or equivalent per week    Comment: occasional   Drug use: Not Currently    Comment: 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, pain killers, marijuana, acid   Sexual activity: Yes    Birth control/protection: None  Other Topics Concern   Not on file  Social History Narrative   Not on file   Social Determinants of Health   Financial Resource Strain: Not on file  Food Insecurity: Not on file  Transportation Needs: Not on file  Physical Activity: Not on file  Stress: Not on file  Social Connections: Not on file    Current Medications:  Current Outpatient Medications:    acetaminophen (TYLENOL) 500 MG tablet, Take 1 tablet by mouth daily as needed., Disp: , Rfl:    brexpiprazole (REXULTI) 2 MG TABS tablet, Take 2 mg by mouth in the morning.,  Disp: , Rfl:    buPROPion (WELLBUTRIN XL) 150 MG 24 hr tablet, Take 450 mg by mouth at bedtime., Disp: , Rfl:    Calcium Carb-Cholecalciferol (CALCIUM 500/D) 500-10 MG-MCG CHEW, Chew 1 tablet by mouth in the morning, at noon, in the evening, and at bedtime., Disp: , Rfl:    dexmethylphenidate (FOCALIN) 10 MG tablet, Take 10 mg by mouth 2 (two) times daily., Disp: , Rfl:    dexmethylphenidate (FOCALIN) 5 MG tablet, Take 5 mg by mouth daily in the afternoon. 5 mg + 10 mg=15 mg total in the afternoon, Disp: , Rfl:    gabapentin (NEURONTIN) 400 MG capsule, Take 400-800 mg by mouth See admin instructions. Take 1 capsule (400 mg) by mouth in the morning & take 2 capsules (800 mg) by mouth at bedtime., Disp: , Rfl:  ipratropium (ATROVENT) 0.03 % nasal spray, Place 2 sprays into both nostrils 2 (two) times daily as needed (allergies.)., Disp: , Rfl:    medroxyPROGESTERone (PROVERA) 10 MG tablet, Take 1 tablet (10 mg total) by mouth daily., Disp: 30 tablet, Rfl: 1   Multiple Vitamins-Minerals (MULTIVITAMIN WITH MINERALS) tablet, Take 1 tablet by mouth in the morning., Disp: , Rfl:    OZEMPIC, 2 MG/DOSE, 8 MG/3ML SOPN, Inject 1 mg into the skin every Thursday., Disp: , Rfl:    senna-docusate (SENOKOT-S) 8.6-50 MG tablet, Take 2 tablets by mouth at bedtime. For AFTER surgery, do not take if having diarrhea (Patient not taking: Reported on 09/04/2022), Disp: 30 tablet, Rfl: 0   traMADol (ULTRAM) 50 MG tablet, Take 1 tablet (50 mg total) by mouth every 6 (six) hours as needed for severe pain. For AFTER surgery only, do not take and drive (Patient not taking: Reported on 09/04/2022), Disp: 5 tablet, Rfl: 0   VIIBRYD 40 MG TABS, Take 40 mg by mouth in the morning., Disp: , Rfl: 1  Review of Systems: + back pain Denies appetite changes, fevers, chills, fatigue, unexplained weight changes. Denies hearing loss, neck lumps or masses, mouth sores, ringing in ears or voice changes. Denies cough or wheezing.  Denies  shortness of breath. Denies chest pain or palpitations. Denies leg swelling. Denies abdominal distention, pain, blood in stools, constipation, diarrhea, nausea, vomiting, or early satiety. Denies pain with intercourse, dysuria, frequency, hematuria or incontinence. Denies hot flashes, pelvic pain, vaginal bleeding or vaginal discharge.   Denies joint pain or muscle pain/cramps. Denies itching, rash, or wounds. Denies dizziness, headaches, numbness or seizures. Denies swollen lymph nodes or glands, denies easy bruising or bleeding. Denies anxiety, depression, confusion, or decreased concentration.  Physical Exam: BP (!) 124/55 (BP Location: Left Arm, Patient Position: Sitting)   Pulse 77   Temp 98.7 F (37.1 C) (Oral)   Resp 16   Ht 5' 5"$  (1.651 m)   Wt 293 lb 6.4 oz (133.1 kg)   LMP 08/25/2022 (Approximate) Comment: irregular bleeding  SpO2 99%   BMI 48.82 kg/m  General: Alert, oriented, no acute distress. HEENT: Normocephalic, atraumatic, sclera anicteric. Chest: Unlabored breathing on room air.  Laboratory & Radiologic Studies: See treatment summary.  Assessment & Plan: Megan Haas is a 47 y.o. woman with low grade endometrioid endometrial adenocarcinoma.  The patient is doing well after surgery.  We discussed results again from her recent surgery.  She was given a copy of her pathology report and pictures taken from her hysteroscopy.  We again reviewed PET scan findings which support findings at the time of surgery, that this is an endometrial primary.  I do not think that the mesenteric lymph node that is FDG avid is related.  This will be something that we follow.  We have previously discuss indeterminate mildly FDG avid inguinal lymph node and two small nonspecific pulmonary nodules.  We reviewed the nature of endometrial cancer and its recommended surgical staging, including total hysterectomy, bilateral salpingo-oophorectomy, and lymph node assessment. The patient is  a suitable candidate for staging via a minimally invasive approach to surgery.  We reviewed that robotic assistance would be used to complete the surgery.   Given perimenopausal status, discussed risks and benefits of taking versus leaving the ovaries.  I would favor removing the ovaries.  Once final pathology is back, if early stage cancer, we can discuss possibility of hormone replacement therapy.  The patient is amenable to proceeding with BSO.  We also discussed increased risk related to prior surgery given possible adhesions.  We reviewed the sentinel lymph node technique. Risks and benefits of sentinel lymph node biopsy was reviewed. We reviewed the technique and ICG dye. The patient DOES NOT have an iodine allergy or known liver dysfunction. We reviewed the false negative rate (0.4%), and that 3% of patients with metastatic disease will not have it detected by SLN biopsy in endometrial cancer. A low risk of allergic reaction to the dye, <0.2% for ICG, has been reported. We also discussed that in the case of failed mapping, which occurs 40% of the time, a bilateral or unilateral lymphadenectomy will be performed at the surgeon's discretion.   Potential benefits of sentinel nodes including a higher detection rate for metastasis due to ultrastaging and potential reduction in operative morbidity. However, there remains uncertainty as to the role for treatment of micrometastatic disease. Further, the benefit of operative morbidity associated with the SLN technique in endometrial cancer is not yet completely known. In other patient populations (e.g. the cervical cancer population) there has been observed reductions in morbidity with SLN biopsy compared to pelvic lymphadenectomy. Lymphedema, nerve dysfunction and lymphocysts are all potential risks with the SLN technique as with complete lymphadenectomy. Additional risks to the patient include the risk of damage to an internal organ while operating in an  altered view (e.g. the black and white image of the robotic fluorescence imaging mode).   The patient was consented for a robotic assisted hysterectomy, bilateral salpingo-oophorectomy, sentinel lymph node evaluation, possible lymph node dissection, possible laparotomy.  Discussed backup plan if does not tolerate Trendelenburg of repeat D&C with Mirena IUD insertion.  The risks of surgery were discussed in detail and she understands these to include infection; wound separation; hernia; vaginal cuff separation, injury to adjacent organs such as bowel, bladder, blood vessels, ureters and nerves; bleeding which may require blood transfusion; anesthesia risk; thromboembolic events; possible death; unforeseen complications; possible need for re-exploration; medical complications such as heart attack, stroke, pleural effusion and pneumonia; and, if full lymphadenectomy is performed the risk of lymphedema and lymphocyst. The patient will receive DVT and antibiotic prophylaxis as indicated. She voiced a clear understanding. She had the opportunity to ask questions. Perioperative instructions were reviewed with her. Prescriptions for post-op medications were sent to her pharmacy of choice.  38 minutes of total time was spent for this patient encounter, including preparation, face-to-face counseling with the patient and coordination of care, and documentation of the encounter.  Jeral Pinch, MD  Division of Gynecologic Oncology  Department of Obstetrics and Gynecology  [facility]

## 2022-09-04 NOTE — Progress Notes (Signed)
Patient here for follow up with Dr. Jeral Pinch and for a pre-operative appointment prior to her scheduled surgery on September 10, 2022. She is scheduled for robotic assisted total laparoscopic hysterectomy, bilateral salpingo-oophorectomy, sentinel lymph node biopsy, possible lymph node dissection, possible laparotomy.  She had her pre-admission testing appointment on 09/07/22 at [facility].  The surgery was discussed in detail.  See after visit summary for additional details. Visual aids used to discuss items related to surgery.     Discussed post-op pain management in detail including the aspects of the enhanced recovery pathway.  Advised her that a new prescription would be sent in for tramadol and it is only to be used for after her upcoming surgery.  We discussed the use of tylenol post-op and to monitor for a maximum of 4,000 mg in a 24 hour period.  Also prescribed sennakot to be used after surgery and to hold if having loose stools.  Discussed bowel regimen in detail.     Discussed the use of SCDs and measures to take at home to prevent DVT including frequent mobility.  Reportable signs and symptoms of DVT discussed. Post-operative instructions discussed and expectations for after surgery. Incisional care discussed as well including reportable signs and symptoms including erythema, drainage, wound separation.     10 minutes spent with the patient and preparing information.  Verbalizing understanding of material discussed. No needs or concerns voiced at the end of the visit.  Advised patient to call for any needs. Advised that her post-operative medications had been prescribed and could be picked up at any time.    This appointment is included in the global surgical bundle as pre-operative teaching and has no charge.

## 2022-09-04 NOTE — Patient Instructions (Signed)
Preparing for your Surgery  Plan for surgery on September 10, 2022 with Dr. Jeral Pinch at [place] will be scheduled for robotic assisted total laparoscopic hysterectomy (removal of the uterus and cervix), bilateral salpingo-oophorectomy (removal of both ovaries and fallopian tubes), sentinel lymph node biopsy, possible lymph node dissection, possible laparotomy (larger incision on your abdomen if needed).   You will need to hold ozempic one week before surgery (7 days before).   Pre-operative Testing -You will receive a phone call from presurgical testing at [facility] to arrange for a pre-operative appointment and lab work.  -Bring your insurance card, copy of an advanced directive if applicable, medication list  -At that visit, you will be asked to sign a consent for a possible blood transfusion in case a transfusion becomes necessary during surgery.  The need for a blood transfusion is rare but having consent is a necessary part of your care.     -You should not be taking blood thinners or aspirin at least ten days prior to surgery unless instructed by your surgeon.  -Do not take supplements such as fish oil (omega 3), red yeast rice, turmeric before your surgery. You want to avoid medications with aspirin in them including headache powders such as BC or Goody's), Excedrin migraine.  Day Before Surgery at [place] will be asked to take in a light diet the day before surgery. You will be advised you can have clear liquids up until 3 hours before your surgery.    Eat a light diet the day before surgery.  Examples including soups, broths, toast, yogurt, mashed potatoes.  AVOID GAS PRODUCING FOODS AND BEVERAGES. Things to avoid include carbonated beverages (fizzy beverages, sodas), raw fruits and raw vegetables (uncooked), or beans.   If your bowels are filled with gas, your surgeon will have difficulty visualizing your pelvic organs which increases your  surgical risks.  Your role in recovery Your role is to become active as soon as directed by your doctor, while still giving yourself time to heal.  Rest when you feel tired. You will be asked to do the following in order to speed your recovery:  - Cough and breathe deeply. This helps to clear and expand your lungs and can prevent pneumonia after surgery.  - [place]. Do mild physical activity. Walking or moving your legs help your circulation and body functions return to normal. Do not try to get up or walk alone the first time after surgery.   -If you develop swelling on one leg or the other, pain in the back of your leg, redness/warmth in one of your legs, please call the office or go to the Emergency Room to have a doppler to rule out a blood clot. For shortness of breath, chest pain-seek care in the Emergency Room as soon as possible. - Actively manage your pain. Managing your pain lets you move in comfort. We will ask you to rate your pain on a scale of zero to 10. It is your responsibility to tell your doctor or nurse where and how much you hurt so your pain can be treated.  Special Considerations -If you are diabetic, you may be placed on insulin after surgery to have closer control over your blood sugars to promote healing and recovery.  This does not mean that you will be discharged on insulin.  If applicable, your oral antidiabetics will be resumed when you are tolerating a solid diet.  -Your  final pathology results from surgery should be available around one week after surgery and the results will be relayed to you when available.  -FMLA forms can be faxed to [phone] and please allow 5-7 business days for completion.  Pain Management After Surgery -You have been prescribed your pain medication and bowel regimen medications before surgery so that you can have these available when you are discharged from the hospital. The pain medication is for use ONLY AFTER  surgery and a new prescription will not be given.   -Make sure that you have Tylenol and Ibuprofen IF YOU ARE ABLE TO TAKE THESE MEDICATIONS at home to use on a regular basis after surgery for pain control. We recommend alternating the medications every hour to six hours since they work differently and are processed in the body differently for pain relief.  -Review the attached handout on narcotic use and their risks and side effects.   Bowel Regimen -You have been prescribed Sennakot-S to take nightly to prevent constipation especially if you are taking the narcotic pain medication intermittently.  It is important to prevent constipation and drink adequate amounts of liquids. You can stop taking this medication when you are not taking pain medication and you are back on your normal bowel routine.  Risks of Surgery Risks of surgery are low but include bleeding, infection, damage to surrounding structures, re-operation, blood clots, and very rarely death.   Blood Transfusion Information (For the consent to be signed before surgery)  We will be checking your blood type before surgery so in case of emergencies, we will know what type of blood you would need.                                            WHAT IS A BLOOD TRANSFUSION?  A transfusion is the replacement of blood or some of its parts. Blood is made up of multiple cells which provide different functions. Red blood cells carry oxygen and are used for blood loss replacement. White blood cells fight against infection. Platelets control bleeding. Plasma helps clot blood. Other blood products are available for specialized needs, such as hemophilia or other clotting disorders. BEFORE THE TRANSFUSION  Who gives blood for transfusions?  You may be able to donate blood to be used at a later date on yourself (autologous donation). Relatives can be asked to donate blood. This is generally not any safer than if you have received blood from a  stranger. The same precautions are taken to ensure safety when a relative's blood is donated. Healthy volunteers who are fully evaluated to make sure their blood is safe. This is blood bank blood. Transfusion therapy is the safest it has ever been in the practice of medicine. Before blood is taken from a donor, a complete history is taken to make sure that person has no history of diseases nor engages in risky social behavior (examples are intravenous drug use or sexual activity with multiple partners). The donor's travel history is screened to minimize risk of transmitting infections, such as malaria. The donated blood is tested for signs of infectious diseases, such as HIV and hepatitis. The blood is then tested to be sure it is compatible with you in order to minimize the chance of a transfusion reaction. If you or a relative donates blood, this is often done in anticipation of surgery and is not appropriate  for emergency situations. It takes many days to process the donated blood. RISKS AND COMPLICATIONS Although transfusion therapy is very safe and saves many lives, the main dangers of transfusion include:  Getting an infectious disease. Developing a transfusion reaction. This is an allergic reaction to something in the blood you were given. Every precaution is taken to prevent this. The decision to have a blood transfusion has been considered carefully by your caregiver before blood is given. Blood is not given unless the benefits outweigh the risks.  AFTER SURGERY INSTRUCTIONS  Return to work: 4-6 weeks if applicable  Activity: 1. Be up and out of the bed during the day.  Take a nap if needed.  You may walk up steps but be careful and use the hand rail.  Stair climbing will tire you more than you think, you may need to stop part way and rest.   2. No lifting or straining for 6 weeks over 10 pounds. No pushing, pulling, straining for 6 weeks.  3. No driving for around 1 week(s).  Do not drive  if you are taking narcotic pain medicine and make sure that your reaction time has returned.   4. You can shower as soon as the next day after surgery. Shower daily.  Use your regular soap and water (not directly on the incision) and pat your incision(s) dry afterwards; don't rub.  No tub baths or submerging your body in water until cleared by your surgeon. If you have the soap that was given to you by pre-surgical testing that was used before surgery, you do not need to use it afterwards because this can irritate your incisions.   5. No sexual activity and nothing in the vagina for 10-12 weeks.  6. You may experience a small amount of clear drainage from your incisions, which is normal.  If the drainage persists, increases, or changes color please call the office.  7. Do not use creams, lotions, or ointments such as neosporin on your incisions after surgery until advised by your surgeon because they can cause removal of the dermabond glue on your incisions.    8. You may experience vaginal spotting after surgery or around the 6-8 week mark from surgery when the stitches at the top of the vagina begin to dissolve.  The spotting is normal but if you experience heavy bleeding, call our office.  9. Take Tylenol or ibuprofen first for pain if you are able to take these medications and only use narcotic pain medication for severe pain not relieved by the Tylenol or Ibuprofen.  Monitor your Tylenol intake to a max of 4,000 mg in a 24 hour period. You can alternate these medications after surgery.  Diet: 1. Low sodium Heart Healthy Diet is recommended but you are cleared to resume your normal (before surgery) diet after your procedure.  2. It is safe to use a laxative, such as Miralax or Colace, if you have difficulty moving your bowels. You have been prescribed Sennakot-S to take at bedtime every evening after surgery to keep bowel movements regular and to prevent constipation.    Wound Care: 1. Keep  clean and dry.  Shower daily.  Reasons to call the Doctor: Fever - Oral temperature greater than 100.4 degrees Fahrenheit Foul-smelling vaginal discharge Difficulty urinating Nausea and vomiting Increased pain at the site of the incision that is unrelieved with pain medicine. Difficulty breathing with or without chest pain New calf pain especially if only on one side Sudden, continuing increased  vaginal bleeding with or without clots.   Contacts: For questions or concerns you should contact:  Dr. Jeral Pinch at [phone]  Joylene John, NP at [phone]  After Hours: call [phone] and have the GYN Oncologist paged/contacted (after 5 pm or on the weekends).  Messages sent via mychart are for non-urgent matters and are not responded to after hours so for urgent needs, please call the after hours number.

## 2022-09-04 NOTE — Patient Instructions (Signed)
Preparing for your Surgery   Plan for surgery on September 10, 2022 with Dr. Jeral Pinch at [place] will be scheduled for robotic assisted total laparoscopic hysterectomy (removal of the uterus and cervix), bilateral salpingo-oophorectomy (removal of both ovaries and fallopian tubes), sentinel lymph node biopsy, possible lymph node dissection, possible laparotomy (larger incision on your abdomen if needed).    You will need to hold ozempic one week before surgery (7 days before).    Pre-operative Testing -You will receive a phone call from presurgical testing at [facility] to arrange for a pre-operative appointment and lab work.   -Bring your insurance card, copy of an advanced directive if applicable, medication list   -At that visit, you will be asked to sign a consent for a possible blood transfusion in case a transfusion becomes necessary during surgery.  The need for a blood transfusion is rare but having consent is a necessary part of your care.      -You should not be taking blood thinners or aspirin at least ten days prior to surgery unless instructed by your surgeon.   -Do not take supplements such as fish oil (omega 3), red yeast rice, turmeric before your surgery. You want to avoid medications with aspirin in them including headache powders such as BC or Goody's), Excedrin migraine.   Day Before Surgery at [place] will be asked to take in a light diet the day before surgery. You will be advised you can have clear liquids up until 3 hours before your surgery.     Eat a light diet the day before surgery.  Examples including soups, broths, toast, yogurt, mashed potatoes.  AVOID GAS PRODUCING FOODS AND BEVERAGES. Things to avoid include carbonated beverages (fizzy beverages, sodas), raw fruits and raw vegetables (uncooked), or beans.    If your bowels are filled with gas, your surgeon will have difficulty visualizing your pelvic organs which increases  your surgical risks.   Your role in recovery Your role is to become active as soon as directed by your doctor, while still giving yourself time to heal.  Rest when you feel tired. You will be asked to do the following in order to speed your recovery:   - Cough and breathe deeply. This helps to clear and expand your lungs and can prevent pneumonia after surgery.  - [place]. Do mild physical activity. Walking or moving your legs help your circulation and body functions return to normal. Do not try to get up or walk alone the first time after surgery.   -If you develop swelling on one leg or the other, pain in the back of your leg, redness/warmth in one of your legs, please call the office or go to the Emergency Room to have a doppler to rule out a blood clot. For shortness of breath, chest pain-seek care in the Emergency Room as soon as possible. - Actively manage your pain. Managing your pain lets you move in comfort. We will ask you to rate your pain on a scale of zero to 10. It is your responsibility to tell your doctor or nurse where and how much you hurt so your pain can be treated.   Special Considerations -If you are diabetic, you may be placed on insulin after surgery to have closer control over your blood sugars to promote healing and recovery.  This does not mean that you will be discharged on insulin.  If applicable, your oral  antidiabetics will be resumed when you are tolerating a solid diet.   -Your final pathology results from surgery should be available around one week after surgery and the results will be relayed to you when available.   -FMLA forms can be faxed to [phone] and please allow 5-7 business days for completion.   Pain Management After Surgery -You have been prescribed your pain medication and bowel regimen medications before surgery so that you can have these available when you are discharged from the hospital. The pain medication is for use ONLY  AFTER surgery and a new prescription will not be given.    -Make sure that you have Tylenol and Ibuprofen IF YOU ARE ABLE TO TAKE THESE MEDICATIONS at home to use on a regular basis after surgery for pain control. We recommend alternating the medications every hour to six hours since they work differently and are processed in the body differently for pain relief.   -Review the attached handout on narcotic use and their risks and side effects.    Bowel Regimen -You have been prescribed Sennakot-S to take nightly to prevent constipation especially if you are taking the narcotic pain medication intermittently.  It is important to prevent constipation and drink adequate amounts of liquids. You can stop taking this medication when you are not taking pain medication and you are back on your normal bowel routine.   Risks of Surgery Risks of surgery are low but include bleeding, infection, damage to surrounding structures, re-operation, blood clots, and very rarely death.     Blood Transfusion Information (For the consent to be signed before surgery)   We will be checking your blood type before surgery so in case of emergencies, we will know what type of blood you would need.                                             WHAT IS A BLOOD TRANSFUSION?   A transfusion is the replacement of blood or some of its parts. Blood is made up of multiple cells which provide different functions. Red blood cells carry oxygen and are used for blood loss replacement. White blood cells fight against infection. Platelets control bleeding. Plasma helps clot blood. Other blood products are available for specialized needs, such as hemophilia or other clotting disorders. BEFORE THE TRANSFUSION  Who gives blood for transfusions?  You may be able to donate blood to be used at a later date on yourself (autologous donation). Relatives can be asked to donate blood. This is generally not any safer than if you have received  blood from a stranger. The same precautions are taken to ensure safety when a relative's blood is donated. Healthy volunteers who are fully evaluated to make sure their blood is safe. This is blood bank blood. Transfusion therapy is the safest it has ever been in the practice of medicine. Before blood is taken from a donor, a complete history is taken to make sure that person has no history of diseases nor engages in risky social behavior (examples are intravenous drug use or sexual activity with multiple partners). The donor's travel history is screened to minimize risk of transmitting infections, such as malaria. The donated blood is tested for signs of infectious diseases, such as HIV and hepatitis. The blood is then tested to be sure it is compatible with you in order to minimize  the chance of a transfusion reaction. If you or a relative donates blood, this is often done in anticipation of surgery and is not appropriate for emergency situations. It takes many days to process the donated blood. RISKS AND COMPLICATIONS Although transfusion therapy is very safe and saves many lives, the main dangers of transfusion include:  Getting an infectious disease. Developing a transfusion reaction. This is an allergic reaction to something in the blood you were given. Every precaution is taken to prevent this. The decision to have a blood transfusion has been considered carefully by your caregiver before blood is given. Blood is not given unless the benefits outweigh the risks.   AFTER SURGERY INSTRUCTIONS   Return to work: 4-6 weeks if applicable   Activity: 1. Be up and out of the bed during the day.  Take a nap if needed.  You may walk up steps but be careful and use the hand rail.  Stair climbing will tire you more than you think, you may need to stop part way and rest.    2. No lifting or straining for 6 weeks over 10 pounds. No pushing, pulling, straining for 6 weeks.   3. No driving for around 1  week(s).  Do not drive if you are taking narcotic pain medicine and make sure that your reaction time has returned.    4. You can shower as soon as the next day after surgery. Shower daily.  Use your regular soap and water (not directly on the incision) and pat your incision(s) dry afterwards; don't rub.  No tub baths or submerging your body in water until cleared by your surgeon. If you have the soap that was given to you by pre-surgical testing that was used before surgery, you do not need to use it afterwards because this can irritate your incisions.    5. No sexual activity and nothing in the vagina for 10-12 weeks.   6. You may experience a small amount of clear drainage from your incisions, which is normal.  If the drainage persists, increases, or changes color please call the office.   7. Do not use creams, lotions, or ointments such as neosporin on your incisions after surgery until advised by your surgeon because they can cause removal of the dermabond glue on your incisions.     8. You may experience vaginal spotting after surgery or around the 6-8 week mark from surgery when the stitches at the top of the vagina begin to dissolve.  The spotting is normal but if you experience heavy bleeding, call our office.   9. Take Tylenol or ibuprofen first for pain if you are able to take these medications and only use narcotic pain medication for severe pain not relieved by the Tylenol or Ibuprofen.  Monitor your Tylenol intake to a max of 4,000 mg in a 24 hour period. You can alternate these medications after surgery.   Diet: 1. Low sodium Heart Healthy Diet is recommended but you are cleared to resume your normal (before surgery) diet after your procedure.   2. It is safe to use a laxative, such as Miralax or Colace, if you have difficulty moving your bowels. You have been prescribed Sennakot-S to take at bedtime every evening after surgery to keep bowel movements regular and to prevent  constipation.     Wound Care: 1. Keep clean and dry.  Shower daily.   Reasons to call the Doctor: Fever - Oral temperature greater than 100.4 degrees Fahrenheit Foul-smelling  vaginal discharge Difficulty urinating Nausea and vomiting Increased pain at the site of the incision that is unrelieved with pain medicine. Difficulty breathing with or without chest pain New calf pain especially if only on one side Sudden, continuing increased vaginal bleeding with or without clots.   Contacts: For questions or concerns you should contact:   Dr. Jeral Pinch at [phone]   Joylene John, NP at [phone]   After Hours: call [phone] and have the GYN Oncologist paged/contacted (after 5 pm or on the weekends).   Messages sent via mychart are for non-urgent matters and are not responded to after hours so for urgent needs, please call the after hours number.

## 2022-09-04 NOTE — Progress Notes (Signed)
Gynecologic Oncology Return Clinic Visit  09/04/22  Reason for Visit: follow-up, treatment planning  Treatment History: The patient endorses a longstanding history of irregular menses.  She describes this as not being sure when her menses will start, often skipping 1 or 2 months.  Her bleeding may be light or heavy.  Menses typically last 4-5 days.  A month before she saw her OB/GYN, she had bleeding on and off for 3 weeks.  She endorses about a 4 to 10-monthhistory of occasional abdominal pain, sometimes upper abdominal pain and sometimes lower abdominal pain.   She was seen by her OB/GYN earlier this month.  At the time of that visit, 2 or 3 larger cervical polyps were seen on exam, partially evulsed. Cervical polyp biopsy: 07/30/2022 reveals well-differentiated invasive adenocarcinoma.  IHC positive for p16, ER and PR.  Negative for p53.  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  08/27/22: Hysteroscopy with endometrial curettings, cervical biopsies, endocervical curettage.  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  This is seen in both the prolapsing endometrial polyp as well as the endometrial curettings.  Cervical biopsies show benign endocervical mucosa, no dysplasia or malignancy.  Endocervical curettage also negative for dysplasia or malignancy.  08/27/22: PET 1. Increased radiotracer uptake localizing to the endometrium compatible with known endometrial adenocarcinoma. 2. No signs of tracer avid retroperitoneal or pelvic lymph nodes. 3. 1.6 cm FDG avid central mesenteric lymph node is identified has an SUV max of 6.38. This is of uncertain clinical significance, and in the absence of retroperitoneal or pelvic adenopathy this would be an unusual location for nodal metastasis. Close interval follow-up is advised. 4. There is a borderline  enlarged right inguinal lymph node with mild tracer uptake. This is nonspecific and may be reactive. 5. Two small nonspecific pulmonary nodules are identified measuring up to 5 mm. These are technically too small to characterize by PET-CT. Attention in these nodules on future surveillance imaging is recommended. 6. Hepatic steatosis.  Interval History: Doing well.  Still having some bleeding after surgery but less than a normal period now.  Denies any significant abdominal or pelvic pain.  Past Medical/Surgical History: Past Medical History:  Diagnosis Date   Adenocarcinoma ([place] 07/2022   cervix   Anemia    Follows w/ Hematology, KRichrd Sox NP @ [facility].S/p 2 iron infusions in 2023.   Anxiety    Arthritis    back   Bipolar 1 disorder ([place]    Follows w/ Dr. ARonney Asters@ [facility], depression, and anxiety.   COVID-19 07/2020   Depression    Diabetes mellitus without complication ([place]    Type 2. Pt is taking Ozempic & follows w/ [facility].   Fibromyalgia    Follows w/ PCP, KBurr Medico NP @ [facility].   History of sleep study    Pt stated she had a sleep study done in 2023 and was told she does not have sleep apnea.   Obesity, Class III, BMI 40-49.9 (morbid obesity) ([place]    Follows with [facility].   PTSD (post-traumatic stress disorder)    hx of   Wears glasses    for distance    Past Surgical History:  Procedure Laterality Date   CERVICAL CONIZATION W/BX N/A 08/27/2022   Procedure: CERVIAL BIOPSIES AND ENDOCERVICAL CURRETTAGE;  Surgeon: TLafonda Mosses MD;  Location: [place];  Service: Gynecology;  Laterality: N/A;   CHOLECYSTECTOMY  2006   COLONOSCOPY W/ POLYPECTOMY  03/26/2021   multiple colon polyps   DILATATION & CURETTAGE/HYSTEROSCOPY WITH MYOSURE N/A 08/27/2022   Procedure: DILATATION & CURETTAGE/HYSTEROSCOPY WITH MYOSURE;  Surgeon: [name], MD;  Location: [facility];  Service: Gynecology;  Laterality: N/A;   ESOPHAGOGASTRODUODENOSCOPY  08/18/2018   GASTROPLASTY DUODENAL SWITCH  01/2019   robotically    Family History  Problem Relation Age of Onset   Diabetes Father    Prostate cancer Father    Hypertension Maternal Aunt    Diabetes Maternal Uncle    Hypertension Maternal Grandmother    Diabetes Paternal Grandmother    Colon cancer Neg Hx    Stomach cancer Neg Hx    Pancreatic cancer Neg Hx    Esophageal cancer Neg Hx    Breast cancer Neg Hx    Ovarian cancer Neg Hx    Endometrial cancer Neg Hx     Social History   Socioeconomic History   Marital status: Married    Spouse name: Not on file   Number of children: Not on file   Years of education: Not on file   Highest education level: Not on file  Occupational History   Not on file  Tobacco Use   Smoking status: Former    Packs/day: 0.75    Years: 10.00    Total pack years: 7.50    Types: Cigarettes    Quit date: 2004    Years since quitting: 20.1   Smokeless tobacco: Never  Vaping Use   Vaping Use: Never used  Substance and Sexual Activity   Alcohol use: Yes    Alcohol/week: 1.0 standard drink of alcohol    Types: 1 Standard drinks or equivalent per week    Comment: occasional   Drug use: Not Currently    Comment: 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, pain killers, marijuana, acid   Sexual activity: Yes    Birth control/protection: None  Other Topics Concern   Not on file  Social History Narrative   Not on file   Social Determinants of Health   Financial Resource Strain: Not on file  Food Insecurity: Not on file  Transportation Needs: Not on file  Physical Activity: Not on file  Stress: Not on file  Social Connections: Not on file    Current Medications:  Current Outpatient Medications:    acetaminophen (TYLENOL) 500 MG tablet, Take 1 tablet by mouth daily as needed., Disp: , Rfl:    brexpiprazole (REXULTI) 2 MG TABS tablet, Take 2 mg by mouth in the morning.,  Disp: , Rfl:    buPROPion (WELLBUTRIN XL) 150 MG 24 hr tablet, Take 450 mg by mouth at bedtime., Disp: , Rfl:    Calcium Carb-Cholecalciferol (CALCIUM 500/D) 500-10 MG-MCG CHEW, Chew 1 tablet by mouth in the morning, at noon, in the evening, and at bedtime., Disp: , Rfl:    dexmethylphenidate (FOCALIN) 10 MG tablet, Take 10 mg by mouth 2 (two) times daily., Disp: , Rfl:    dexmethylphenidate (FOCALIN) 5 MG tablet, Take 5 mg by mouth daily in the afternoon. 5 mg + 10 mg=15 mg total in the afternoon, Disp: , Rfl:    gabapentin (NEURONTIN) 400 MG capsule, Take 400-800 mg by mouth See admin instructions. Take 1 capsule (400 mg) by mouth in the morning & take 2 capsules (800 mg) by mouth at bedtime., Disp: , Rfl:  ipratropium (ATROVENT) 0.03 % nasal spray, Place 2 sprays into both nostrils 2 (two) times daily as needed (allergies.)., Disp: , Rfl:    medroxyPROGESTERone (PROVERA) 10 MG tablet, Take 1 tablet (10 mg total) by mouth daily., Disp: 30 tablet, Rfl: 1   Multiple Vitamins-Minerals (MULTIVITAMIN WITH MINERALS) tablet, Take 1 tablet by mouth in the morning., Disp: , Rfl:    OZEMPIC, 2 MG/DOSE, 8 MG/3ML SOPN, Inject 1 mg into the skin every Thursday., Disp: , Rfl:    senna-docusate (SENOKOT-S) 8.6-50 MG tablet, Take 2 tablets by mouth at bedtime. For AFTER surgery, do not take if having diarrhea (Patient not taking: Reported on 09/04/2022), Disp: 30 tablet, Rfl: 0   traMADol (ULTRAM) 50 MG tablet, Take 1 tablet (50 mg total) by mouth every 6 (six) hours as needed for severe pain. For AFTER surgery only, do not take and drive (Patient not taking: Reported on 09/04/2022), Disp: 5 tablet, Rfl: 0   VIIBRYD 40 MG TABS, Take 40 mg by mouth in the morning., Disp: , Rfl: 1  Review of Systems: + back pain Denies appetite changes, fevers, chills, fatigue, unexplained weight changes. Denies hearing loss, neck lumps or masses, mouth sores, ringing in ears or voice changes. Denies cough or wheezing.  Denies  shortness of breath. Denies chest pain or palpitations. Denies leg swelling. Denies abdominal distention, pain, blood in stools, constipation, diarrhea, nausea, vomiting, or early satiety. Denies pain with intercourse, dysuria, frequency, hematuria or incontinence. Denies hot flashes, pelvic pain, vaginal bleeding or vaginal discharge.   Denies joint pain or muscle pain/cramps. Denies itching, rash, or wounds. Denies dizziness, headaches, numbness or seizures. Denies swollen lymph nodes or glands, denies easy bruising or bleeding. Denies anxiety, depression, confusion, or decreased concentration.  Physical Exam: BP (!) 124/55 (BP Location: Left Arm, Patient Position: Sitting)   Pulse 77   Temp 98.7 F (37.1 C) (Oral)   Resp 16   Ht 5' 5"$  (1.651 m)   Wt 293 lb 6.4 oz (133.1 kg)   LMP 08/25/2022 (Approximate) Comment: irregular bleeding  SpO2 99%   BMI 48.82 kg/m  General: Alert, oriented, no acute distress. HEENT: Normocephalic, atraumatic, sclera anicteric. Chest: Unlabored breathing on room air.  Laboratory & Radiologic Studies: See treatment summary.  Assessment & Plan: Megan Haas is a 47 y.o. woman with low grade endometrioid endometrial adenocarcinoma.  The patient is doing well after surgery.  We discussed results again from her recent surgery.  She was given a copy of her pathology report and pictures taken from her hysteroscopy.  We again reviewed PET scan findings which support findings at the time of surgery, that this is an endometrial primary.  I do not think that the mesenteric lymph node that is FDG avid is related.  This will be something that we follow.  We have previously discuss indeterminate mildly FDG avid inguinal lymph node and two small nonspecific pulmonary nodules.  We reviewed the nature of endometrial cancer and its recommended surgical staging, including total hysterectomy, bilateral salpingo-oophorectomy, and lymph node assessment. The patient is  a suitable candidate for staging via a minimally invasive approach to surgery.  We reviewed that robotic assistance would be used to complete the surgery.   Given perimenopausal status, discussed risks and benefits of taking versus leaving the ovaries.  I would favor removing the ovaries.  Once final pathology is back, if early stage cancer, we can discuss possibility of hormone replacement therapy.  The patient is amenable to proceeding with BSO.  We also discussed increased risk related to prior surgery given possible adhesions.  We reviewed the sentinel lymph node technique. Risks and benefits of sentinel lymph node biopsy was reviewed. We reviewed the technique and ICG dye. The patient DOES NOT have an iodine allergy or known liver dysfunction. We reviewed the false negative rate (0.4%), and that 3% of patients with metastatic disease will not have it detected by SLN biopsy in endometrial cancer. A low risk of allergic reaction to the dye, <0.2% for ICG, has been reported. We also discussed that in the case of failed mapping, which occurs 40% of the time, a bilateral or unilateral lymphadenectomy will be performed at the surgeon's discretion.   Potential benefits of sentinel nodes including a higher detection rate for metastasis due to ultrastaging and potential reduction in operative morbidity. However, there remains uncertainty as to the role for treatment of micrometastatic disease. Further, the benefit of operative morbidity associated with the SLN technique in endometrial cancer is not yet completely known. In other patient populations (e.g. the cervical cancer population) there has been observed reductions in morbidity with SLN biopsy compared to pelvic lymphadenectomy. Lymphedema, nerve dysfunction and lymphocysts are all potential risks with the SLN technique as with complete lymphadenectomy. Additional risks to the patient include the risk of damage to an internal organ while operating in an  altered view (e.g. the black and white image of the robotic fluorescence imaging mode).   The patient was consented for a robotic assisted hysterectomy, bilateral salpingo-oophorectomy, sentinel lymph node evaluation, possible lymph node dissection, possible laparotomy.  Discussed backup plan if does not tolerate Trendelenburg of repeat D&C with Mirena IUD insertion.  The risks of surgery were discussed in detail and she understands these to include infection; wound separation; hernia; vaginal cuff separation, injury to adjacent organs such as bowel, bladder, blood vessels, ureters and nerves; bleeding which may require blood transfusion; anesthesia risk; thromboembolic events; possible death; unforeseen complications; possible need for re-exploration; medical complications such as heart attack, stroke, pleural effusion and pneumonia; and, if full lymphadenectomy is performed the risk of lymphedema and lymphocyst. The patient will receive DVT and antibiotic prophylaxis as indicated. She voiced a clear understanding. She had the opportunity to ask questions. Perioperative instructions were reviewed with her. Prescriptions for post-op medications were sent to her pharmacy of choice.  38 minutes of total time was spent for this patient encounter, including preparation, face-to-face counseling with the patient and coordination of care, and documentation of the encounter.  Jeral Pinch, MD  Division of Gynecologic Oncology  Department of Obstetrics and Gynecology  [facility]

## 2022-09-07 ENCOUNTER — Telehealth: Payer: Self-pay

## 2022-09-07 ENCOUNTER — Encounter (HOSPITAL_COMMUNITY)
Admission: RE | Admit: 2022-09-07 | Discharge: 2022-09-07 | Disposition: A | Discharge status: Home / Self Care | Payer: Medicare Other | Source: Ambulatory Visit | Attending: Gynecologic Oncology | Admitting: Gynecologic Oncology

## 2022-09-07 ENCOUNTER — Other Ambulatory Visit: Payer: Self-pay

## 2022-09-07 ENCOUNTER — Encounter (HOSPITAL_COMMUNITY): Payer: Self-pay

## 2022-09-07 DIAGNOSIS — Z01812 Encounter for preprocedural laboratory examination: Secondary | ICD-10-CM | POA: Diagnosis not present

## 2022-09-07 DIAGNOSIS — C541 Malignant neoplasm of endometrium: Secondary | ICD-10-CM | POA: Diagnosis not present

## 2022-09-07 DIAGNOSIS — E119 Type 2 diabetes mellitus without complications: Secondary | ICD-10-CM | POA: Diagnosis not present

## 2022-09-07 LAB — COMPREHENSIVE METABOLIC PANEL
ALT: 37 U/L (ref 0–44)
AST: 25 U/L (ref 15–41)
Albumin: 3.6 g/dL (ref 3.5–5.0)
Alkaline Phosphatase: 58 U/L (ref 38–126)
Anion gap: 7 (ref 5–15)
BUN: 8 mg/dL (ref 6–20)
CO2: 25 mmol/L (ref 22–32)
Calcium: 8.6 mg/dL — ABNORMAL LOW (ref 8.9–10.3)
Chloride: 104 mmol/L (ref 98–111)
Creatinine, Ser: 0.88 mg/dL (ref 0.44–1.00)
GFR, Estimated: >60 mL/min (ref >60)
Glucose, Bld: 224 mg/dL — ABNORMAL HIGH (ref 70–99)
Potassium: 3.8 mmol/L (ref 3.5–5.1)
Sodium: 136 mmol/L (ref 135–145)
Total Bilirubin: 0.4 mg/dL (ref 0.3–1.2)
Total Protein: 6.6 g/dL (ref 6.5–8.1)

## 2022-09-07 LAB — CBC
HCT: 36.8 % (ref 36.0–46.0)
Hemoglobin: 11.5 g/dL — ABNORMAL LOW (ref 12.0–15.0)
MCH: 33.2 pg (ref 26.0–34.0)
MCHC: 31.3 g/dL (ref 30.0–36.0)
MCV: 106.4 fL — ABNORMAL HIGH (ref 80.0–100.0)
Platelets: 373 10*3/uL (ref 150–400)
RBC: 3.46 MIL/uL — ABNORMAL LOW (ref 3.87–5.11)
RDW: 13.2 % (ref 11.5–15.5)
WBC: 11 10*3/uL — ABNORMAL HIGH (ref 4.0–10.5)
nRBC: 0 % (ref 0.0–0.2)

## 2022-09-07 LAB — HEMOGLOBIN A1C
Hgb A1c MFr Bld: 5.7 % — ABNORMAL HIGH (ref 4.8–5.6)
Mean Plasma Glucose: 116.89 mg/dL

## 2022-09-07 LAB — GLUCOSE, CAPILLARY: Glucose-Capillary: 227 mg/dL — ABNORMAL HIGH (ref 70–99)

## 2022-09-07 NOTE — Progress Notes (Signed)
Anesthesia note:  Bowel prep reminder: no   PCP - kim Millsaps Cardiologist -none Other-   Chest x-ray - no EKG - 08/27/22-epic Stress Test - no ECHO - no Cardiac Cath - no CABG-no Pacemaker/ICD device last checked:NA  Sleep Study - yes-negative CPAP - no   CBG at PAT visit-227 Fasting Blood Sugar at home-110-140 Checks Blood Sugar _once a month____  Blood Thinner:no Blood Thinner Instructions: Aspirin Instructions: Last Dose:  Anesthesia review: Yes  reason:blood sugar  Patient denies shortness of breath, fever, cough and chest pain at PAT appointment. Pt has a BMI of 48.4 but reports no SOB with ADLs. She had to hold her Ozempic for previous procedures and hasn't taken it since the last week of Jan. Her CBG at the PAT visit was 227.She is also on a cleanse.  I told her to eat a strict diabetic diet until the full liquid diet the day before surgery and test her CBG daily as well as call her Dr. to get on a medication for DM that is only hold day of surgery. Patient verbalized understanding of instructions that were given to them at the PAT appointment. Patient was also instructed that they will need to review over the PAT instructions again at home before surgery Yes her husband was with her.Marland Kitchen

## 2022-09-07 NOTE — Telephone Encounter (Signed)
Told Megan Haas that her glucose is elevated today at 224. Melissa would like for her to monitor her blood sugars at home twice a day and review with Pre-op nurse when they call her on Wednesday. Pt verbalized understanding.

## 2022-09-08 ENCOUNTER — Other Ambulatory Visit: Payer: Self-pay | Admitting: Gynecologic Oncology

## 2022-09-08 DIAGNOSIS — N939 Abnormal uterine and vaginal bleeding, unspecified: Secondary | ICD-10-CM

## 2022-09-09 ENCOUNTER — Telehealth: Payer: Self-pay

## 2022-09-09 NOTE — Telephone Encounter (Signed)
Telephone call to check on pre-operative status.  Patient compliant with pre-operative instructions.  Reinforced nothing to eat after midnight. Clear liquids until 0830. Patient to arrive at 0915.   Megan Haas stated that she  has been following her BS since Monday 09-07-22. She has been following a strict diabetic diet.  Fasting BS on 2-13   222  BS 2 hrs post meal 219 Fasting BS on 2-14  197    BS 2 hrs post meal 203  No questions or concerns voiced.  Instructed to call for any needs.

## 2022-09-10 ENCOUNTER — Encounter (HOSPITAL_COMMUNITY)
Admission: RE | Disposition: A | Discharge status: Home / Self Care | Payer: Self-pay | Source: Home / Self Care | Attending: Gynecologic Oncology

## 2022-09-10 ENCOUNTER — Ambulatory Visit (HOSPITAL_COMMUNITY)
Admission: RE | Admit: 2022-09-10 | Discharge: 2022-09-10 | Disposition: A | Discharge status: Home / Self Care | Payer: Medicare Other | Attending: Gynecologic Oncology | Admitting: Gynecologic Oncology

## 2022-09-10 ENCOUNTER — Encounter (HOSPITAL_COMMUNITY): Payer: Self-pay | Admitting: Gynecologic Oncology

## 2022-09-10 ENCOUNTER — Other Ambulatory Visit: Payer: Self-pay

## 2022-09-10 ENCOUNTER — Ambulatory Visit (HOSPITAL_COMMUNITY): Payer: Medicare Other | Admitting: Physician Assistant

## 2022-09-10 ENCOUNTER — Ambulatory Visit (HOSPITAL_BASED_OUTPATIENT_CLINIC_OR_DEPARTMENT_OTHER): Payer: Medicare Other | Admitting: Registered Nurse

## 2022-09-10 DIAGNOSIS — C541 Malignant neoplasm of endometrium: Secondary | ICD-10-CM | POA: Insufficient documentation

## 2022-09-10 DIAGNOSIS — Z87891 Personal history of nicotine dependence: Secondary | ICD-10-CM | POA: Insufficient documentation

## 2022-09-10 DIAGNOSIS — E119 Type 2 diabetes mellitus without complications: Secondary | ICD-10-CM

## 2022-09-10 DIAGNOSIS — N888 Other specified noninflammatory disorders of cervix uteri: Secondary | ICD-10-CM | POA: Insufficient documentation

## 2022-09-10 DIAGNOSIS — Z6841 Body Mass Index (BMI) 40.0 and over, adult: Secondary | ICD-10-CM | POA: Diagnosis not present

## 2022-09-10 DIAGNOSIS — D271 Benign neoplasm of left ovary: Secondary | ICD-10-CM | POA: Insufficient documentation

## 2022-09-10 DIAGNOSIS — Z7985 Long-term (current) use of injectable non-insulin antidiabetic drugs: Secondary | ICD-10-CM

## 2022-09-10 DIAGNOSIS — N736 Female pelvic peritoneal adhesions (postinfective): Secondary | ICD-10-CM | POA: Diagnosis not present

## 2022-09-10 DIAGNOSIS — D630 Anemia in neoplastic disease: Secondary | ICD-10-CM | POA: Diagnosis not present

## 2022-09-10 DIAGNOSIS — Z01818 Encounter for other preprocedural examination: Secondary | ICD-10-CM

## 2022-09-10 DIAGNOSIS — N838 Other noninflammatory disorders of ovary, fallopian tube and broad ligament: Secondary | ICD-10-CM | POA: Insufficient documentation

## 2022-09-10 HISTORY — PX: ROBOTIC ASSISTED LAPAROSCOPIC HYSTERECTOMY AND SALPINGECTOMY: SHX6379

## 2022-09-10 HISTORY — PX: SENTINEL NODE BIOPSY: SHX6608

## 2022-09-10 LAB — TYPE AND SCREEN
ABO/RH(D): A POS
Antibody Screen: NEGATIVE

## 2022-09-10 LAB — CBC
HCT: 35 % — ABNORMAL LOW (ref 36.0–46.0)
Hemoglobin: 11.1 g/dL — ABNORMAL LOW (ref 12.0–15.0)
MCH: 32 pg (ref 26.0–34.0)
MCHC: 31.7 g/dL (ref 30.0–36.0)
MCV: 100.9 fL — ABNORMAL HIGH (ref 80.0–100.0)
Platelets: 333 10*3/uL (ref 150–400)
RBC: 3.47 MIL/uL — ABNORMAL LOW (ref 3.87–5.11)
RDW: 13.3 % (ref 11.5–15.5)
WBC: 14.2 10*3/uL — ABNORMAL HIGH (ref 4.0–10.5)
nRBC: 0 % (ref 0.0–0.2)

## 2022-09-10 LAB — GLUCOSE, CAPILLARY
Glucose-Capillary: 179 mg/dL — ABNORMAL HIGH (ref 70–99)
Glucose-Capillary: 203 mg/dL — ABNORMAL HIGH (ref 70–99)

## 2022-09-10 LAB — POCT PREGNANCY, URINE: Preg Test, Ur: NEGATIVE

## 2022-09-10 LAB — ABO/RH: ABO/RH(D): A POS

## 2022-09-10 SURGERY — XI ROBOTIC ASSISTED LAPAROSCOPIC HYSTERECTOMY AND SALPINGECTOMY
Anesthesia: General

## 2022-09-10 MED ORDER — ACETAMINOPHEN 500 MG PO TABS: 1000.0000 mg | ORAL_TABLET | Freq: Once | ORAL | Status: DC

## 2022-09-10 MED ORDER — ROCURONIUM BROMIDE 10 MG/ML (PF) SYRINGE
PREFILLED_SYRINGE | INTRAVENOUS | Status: AC
  Filled 2022-09-10: qty 10

## 2022-09-10 MED ORDER — OXYCODONE HCL 5 MG PO TABS
ORAL_TABLET | ORAL | Status: AC
  Filled 2022-09-10: qty 1

## 2022-09-10 MED ORDER — DEXAMETHASONE SODIUM PHOSPHATE 10 MG/ML IJ SOLN
INTRAMUSCULAR | Status: AC
  Filled 2022-09-10: qty 1

## 2022-09-10 MED ORDER — FENTANYL CITRATE PF 50 MCG/ML IJ SOSY
PREFILLED_SYRINGE | INTRAMUSCULAR | Status: AC
  Filled 2022-09-10: qty 1

## 2022-09-10 MED ORDER — SUGAMMADEX SODIUM 500 MG/5ML IV SOLN
INTRAVENOUS | Status: AC
  Filled 2022-09-10: qty 5

## 2022-09-10 MED ORDER — STERILE WATER FOR IRRIGATION IR SOLN
Status: DC | PRN
  Administered 2022-09-10: 1000 mL

## 2022-09-10 MED ORDER — FENTANYL CITRATE PF 50 MCG/ML IJ SOSY
PREFILLED_SYRINGE | INTRAMUSCULAR | Status: AC
  Filled 2022-09-10: qty 2

## 2022-09-10 MED ORDER — CHLORHEXIDINE GLUCONATE 0.12 % MT SOLN
15.0000 mL | Freq: Once | OROMUCOSAL | Status: AC
  Administered 2022-09-10: 15 mL via OROMUCOSAL

## 2022-09-10 MED ORDER — ONDANSETRON HCL 4 MG/2ML IJ SOLN
INTRAMUSCULAR | Status: DC | PRN
  Administered 2022-09-10: 4 mg via INTRAVENOUS

## 2022-09-10 MED ORDER — SUCCINYLCHOLINE CHLORIDE 200 MG/10ML IV SOSY
PREFILLED_SYRINGE | INTRAVENOUS | Status: DC | PRN
  Administered 2022-09-10: 140 mg via INTRAVENOUS

## 2022-09-10 MED ORDER — GLYCOPYRROLATE 0.2 MG/ML IJ SOLN
INTRAMUSCULAR | Status: DC | PRN
  Administered 2022-09-10: .1 mg via INTRAVENOUS

## 2022-09-10 MED ORDER — ORAL CARE MOUTH RINSE: 15.0000 mL | Freq: Once | OROMUCOSAL | Status: AC

## 2022-09-10 MED ORDER — BUPIVACAINE HCL 0.25 % IJ SOLN
INTRAMUSCULAR | Status: AC
  Filled 2022-09-10: qty 1

## 2022-09-10 MED ORDER — EPHEDRINE 5 MG/ML INJ
INTRAVENOUS | Status: AC
  Filled 2022-09-10: qty 5

## 2022-09-10 MED ORDER — CEFAZOLIN IN SODIUM CHLORIDE 3-0.9 GM/100ML-% IV SOLN
3.0000 g | INTRAVENOUS | Status: AC
  Administered 2022-09-10: 3 g via INTRAVENOUS
  Filled 2022-09-10: qty 100

## 2022-09-10 MED ORDER — DEXAMETHASONE SODIUM PHOSPHATE 4 MG/ML IJ SOLN: 4.0000 mg | INTRAMUSCULAR | Status: DC

## 2022-09-10 MED ORDER — FENTANYL CITRATE (PF) 100 MCG/2ML IJ SOLN
INTRAMUSCULAR | Status: AC
  Filled 2022-09-10: qty 2

## 2022-09-10 MED ORDER — KETOROLAC TROMETHAMINE 15 MG/ML IJ SOLN: 15.0000 mg | INTRAMUSCULAR | Status: DC

## 2022-09-10 MED ORDER — OXYCODONE HCL 5 MG/5ML PO SOLN: 5.0000 mg | Freq: Once | ORAL | Status: AC | PRN

## 2022-09-10 MED ORDER — OXYCODONE HCL 5 MG PO TABS
5.0000 mg | ORAL_TABLET | Freq: Once | ORAL | Status: AC | PRN
  Administered 2022-09-10: 5 mg via ORAL

## 2022-09-10 MED ORDER — MIDAZOLAM HCL 5 MG/5ML IJ SOLN
INTRAMUSCULAR | Status: DC | PRN
  Administered 2022-09-10: 2 mg via INTRAVENOUS

## 2022-09-10 MED ORDER — FENTANYL CITRATE (PF) 100 MCG/2ML IJ SOLN
INTRAMUSCULAR | Status: DC | PRN
  Administered 2022-09-10: 100 ug via INTRAVENOUS
  Administered 2022-09-10 (×2): 50 ug via INTRAVENOUS

## 2022-09-10 MED ORDER — SCOPOLAMINE 1 MG/3DAYS TD PT72
1.0000 | MEDICATED_PATCH | TRANSDERMAL | Status: DC
  Administered 2022-09-10: 1.5 mg via TRANSDERMAL
  Filled 2022-09-10: qty 1

## 2022-09-10 MED ORDER — FENTANYL CITRATE PF 50 MCG/ML IJ SOSY
25.0000 ug | PREFILLED_SYRINGE | INTRAMUSCULAR | Status: DC | PRN
  Administered 2022-09-10 (×3): 50 ug via INTRAVENOUS

## 2022-09-10 MED ORDER — PROMETHAZINE HCL 25 MG/ML IJ SOLN: 6.2500 mg | INTRAMUSCULAR | Status: DC | PRN

## 2022-09-10 MED ORDER — DEXAMETHASONE SODIUM PHOSPHATE 10 MG/ML IJ SOLN
INTRAMUSCULAR | Status: DC | PRN
  Administered 2022-09-10: 8 mg via INTRAVENOUS

## 2022-09-10 MED ORDER — MIDAZOLAM HCL 2 MG/2ML IJ SOLN
INTRAMUSCULAR | Status: AC
  Filled 2022-09-10: qty 2

## 2022-09-10 MED ORDER — LACTATED RINGERS IR SOLN
Status: DC | PRN
  Administered 2022-09-10: 1000 mL

## 2022-09-10 MED ORDER — ROCURONIUM BROMIDE 10 MG/ML (PF) SYRINGE
PREFILLED_SYRINGE | INTRAVENOUS | Status: DC | PRN
  Administered 2022-09-10: 70 mg via INTRAVENOUS
  Administered 2022-09-10: 20 mg via INTRAVENOUS
  Administered 2022-09-10: 30 mg via INTRAVENOUS

## 2022-09-10 MED ORDER — LACTATED RINGERS IV SOLN: INTRAVENOUS | Status: DC | PRN

## 2022-09-10 MED ORDER — PROPOFOL 10 MG/ML IV BOLUS
INTRAVENOUS | Status: DC | PRN
  Administered 2022-09-10: 200 mg via INTRAVENOUS

## 2022-09-10 MED ORDER — ONDANSETRON HCL 4 MG/2ML IJ SOLN
INTRAMUSCULAR | Status: AC
  Filled 2022-09-10: qty 2

## 2022-09-10 MED ORDER — PROPOFOL 10 MG/ML IV BOLUS
INTRAVENOUS | Status: AC
  Filled 2022-09-10: qty 20

## 2022-09-10 MED ORDER — BUPIVACAINE HCL 0.25 % IJ SOLN
INTRAMUSCULAR | Status: DC | PRN
  Administered 2022-09-10: 34 mL

## 2022-09-10 MED ORDER — STERILE WATER FOR INJECTION IJ SOLN
INTRAMUSCULAR | Status: AC
  Filled 2022-09-10: qty 10

## 2022-09-10 MED ORDER — LIDOCAINE 2% (20 MG/ML) 5 ML SYRINGE
INTRAMUSCULAR | Status: DC | PRN
  Administered 2022-09-10: 60 mg via INTRAVENOUS

## 2022-09-10 MED ORDER — LACTATED RINGERS IV SOLN: INTRAVENOUS | Status: DC

## 2022-09-10 MED ORDER — STERILE WATER FOR INJECTION IJ SOLN
INTRAMUSCULAR | Status: DC | PRN
  Administered 2022-09-10: 10 mL via INTRAMUSCULAR

## 2022-09-10 MED ORDER — SUGAMMADEX SODIUM 500 MG/5ML IV SOLN
INTRAVENOUS | Status: DC | PRN
  Administered 2022-09-10: 500 mg via INTRAVENOUS

## 2022-09-10 MED ORDER — HEPARIN SODIUM (PORCINE) 5000 UNIT/ML IJ SOLN
5000.0000 [IU] | INTRAMUSCULAR | Status: AC
  Administered 2022-09-10: 5000 [IU] via SUBCUTANEOUS
  Filled 2022-09-10: qty 1

## 2022-09-10 MED ORDER — CELECOXIB 200 MG PO CAPS
200.0000 mg | ORAL_CAPSULE | Freq: Once | ORAL | Status: AC
  Administered 2022-09-10: 200 mg via ORAL
  Filled 2022-09-10: qty 1

## 2022-09-10 MED ORDER — STERILE WATER FOR INJECTION IJ SOLN
INTRAMUSCULAR | Status: DC | PRN
  Administered 2022-09-10: 10 mL

## 2022-09-10 MED ORDER — ACETAMINOPHEN 500 MG PO TABS
1000.0000 mg | ORAL_TABLET | ORAL | Status: AC
  Administered 2022-09-10: 1000 mg via ORAL
  Filled 2022-09-10: qty 2

## 2022-09-10 MED ORDER — LIDOCAINE HCL (PF) 2 % IJ SOLN
INTRAMUSCULAR | Status: AC
  Filled 2022-09-10: qty 5

## 2022-09-10 MED ORDER — PHENYLEPHRINE HCL-NACL 20-0.9 MG/250ML-% IV SOLN
INTRAVENOUS | Status: DC | PRN
  Administered 2022-09-10: 25 ug/min via INTRAVENOUS

## 2022-09-10 MED ORDER — EPHEDRINE SULFATE-NACL 50-0.9 MG/10ML-% IV SOSY
PREFILLED_SYRINGE | INTRAVENOUS | Status: DC | PRN
  Administered 2022-09-10 (×3): 5 mg via INTRAVENOUS

## 2022-09-10 SURGICAL SUPPLY — 81 items
ADH SKN CLS APL DERMABOND .7 (GAUZE/BANDAGES/DRESSINGS) ×2
AGENT HMST KT MTR STRL THRMB (HEMOSTASIS)
APL ESCP 34 STRL LF DISP (HEMOSTASIS)
APPLICATOR SURGIFLO ENDO (HEMOSTASIS) IMPLANT
BAG COUNTER SPONGE SURGICOUNT (BAG) IMPLANT
BAG LAPAROSCOPIC 12 15 PORT 16 (BASKET) IMPLANT
BAG RETRIEVAL 12/15 (BASKET)
BAG SPNG CNTER NS LX DISP (BAG)
BLADE SURG SZ10 CARB STEEL (BLADE) IMPLANT
COVER BACK TABLE 60X90IN (DRAPES) ×2 IMPLANT
COVER TIP SHEARS 8 DVNC (MISCELLANEOUS) ×2 IMPLANT
COVER TIP SHEARS 8MM DA VINCI (MISCELLANEOUS) ×2
DERMABOND ADVANCED .7 DNX12 (GAUZE/BANDAGES/DRESSINGS) ×2 IMPLANT
DRAPE ARM DVNC X/XI (DISPOSABLE) ×8 IMPLANT
DRAPE COLUMN DVNC XI (DISPOSABLE) ×2 IMPLANT
DRAPE DA VINCI XI ARM (DISPOSABLE) ×8
DRAPE DA VINCI XI COLUMN (DISPOSABLE) ×2
DRAPE SHEET LG 3/4 BI-LAMINATE (DRAPES) ×2 IMPLANT
DRAPE SURG IRRIG POUCH 19X23 (DRAPES) ×2 IMPLANT
DRSG OPSITE POSTOP 4X6 (GAUZE/BANDAGES/DRESSINGS) IMPLANT
DRSG OPSITE POSTOP 4X8 (GAUZE/BANDAGES/DRESSINGS) IMPLANT
DRSG TEGADERM 6X8 (GAUZE/BANDAGES/DRESSINGS) IMPLANT
ELECT PENCIL ROCKER SW 15FT (MISCELLANEOUS) IMPLANT
ELECT REM PT RETURN 15FT ADLT (MISCELLANEOUS) ×2 IMPLANT
GAUZE 4X4 16PLY ~~LOC~~+RFID DBL (SPONGE) ×4 IMPLANT
GLOVE BIO SURGEON STRL SZ 6 (GLOVE) ×8 IMPLANT
GLOVE BIO SURGEON STRL SZ 6.5 (GLOVE) ×2 IMPLANT
GOWN STRL REUS W/ TWL LRG LVL3 (GOWN DISPOSABLE) ×8 IMPLANT
GOWN STRL REUS W/TWL LRG LVL3 (GOWN DISPOSABLE) ×8
GRASPER SUT TROCAR 14GX15 (MISCELLANEOUS) IMPLANT
HIBICLENS CHG 4% 4OZ BTL (MISCELLANEOUS) ×4 IMPLANT
HOLDER FOLEY CATH W/STRAP (MISCELLANEOUS) IMPLANT
IRRIG SUCT STRYKERFLOW 2 WTIP (MISCELLANEOUS) ×2
IRRIGATION SUCT STRKRFLW 2 WTP (MISCELLANEOUS) ×2 IMPLANT
KIT PROCEDURE DA VINCI SI (MISCELLANEOUS) ×2
KIT PROCEDURE DVNC SI (MISCELLANEOUS) IMPLANT
KIT TURNOVER KIT A (KITS) IMPLANT
LIGASURE IMPACT 36 18CM CVD LR (INSTRUMENTS) IMPLANT
MANIPULATOR ADVINCU DEL 3.0 PL (MISCELLANEOUS) IMPLANT
MANIPULATOR ADVINCU DEL 3.5 PL (MISCELLANEOUS) IMPLANT
MANIPULATOR UTERINE 4.5 ZUMI (MISCELLANEOUS) IMPLANT
NDL HYPO 21X1.5 SAFETY (NEEDLE) ×2 IMPLANT
NDL SPNL 18GX3.5 QUINCKE PK (NEEDLE) IMPLANT
NEEDLE HYPO 21X1.5 SAFETY (NEEDLE) ×2 IMPLANT
NEEDLE SPNL 18GX3.5 QUINCKE PK (NEEDLE) ×2 IMPLANT
OBTURATOR OPTICAL STANDARD 8MM (TROCAR) ×2
OBTURATOR OPTICAL STND 8 DVNC (TROCAR) ×2
OBTURATOR OPTICALSTD 8 DVNC (TROCAR) ×2 IMPLANT
PACK ROBOT GYN CUSTOM WL (TRAY / TRAY PROCEDURE) ×2 IMPLANT
PAD POSITIONING PINK XL (MISCELLANEOUS) ×2 IMPLANT
PORT ACCESS TROCAR AIRSEAL 12 (TROCAR) IMPLANT
SCRUB CHG 4% DYNA-HEX 4OZ (MISCELLANEOUS) IMPLANT
SEAL CANN UNIV 5-8 DVNC XI (MISCELLANEOUS) ×6 IMPLANT
SEAL XI 5MM-8MM UNIVERSAL (MISCELLANEOUS) ×8
SET TRI-LUMEN FLTR TB AIRSEAL (TUBING) ×2 IMPLANT
SPIKE FLUID TRANSFER (MISCELLANEOUS) ×2 IMPLANT
SPONGE T-LAP 18X18 ~~LOC~~+RFID (SPONGE) IMPLANT
SURGIFLO W/THROMBIN 8M KIT (HEMOSTASIS) IMPLANT
SUT MNCRL AB 4-0 PS2 18 (SUTURE) IMPLANT
SUT PDS AB 1 TP1 96 (SUTURE) IMPLANT
SUT V-LOC 180 0-0 GS22 (SUTURE) IMPLANT
SUT VIC AB 0 CT1 27 (SUTURE)
SUT VIC AB 0 CT1 27XBRD ANTBC (SUTURE) IMPLANT
SUT VIC AB 2-0 CT1 27 (SUTURE)
SUT VIC AB 2-0 CT1 TAPERPNT 27 (SUTURE) IMPLANT
SUT VIC AB 4-0 PS2 18 (SUTURE) ×4 IMPLANT
SUT VICRYL 0 27 CT2 27 ABS (SUTURE) ×2 IMPLANT
SUT VLOC 180 0 9IN  GS21 (SUTURE)
SUT VLOC 180 0 9IN GS21 (SUTURE) IMPLANT
SYR 10ML LL (SYRINGE) IMPLANT
SYS BAG RETRIEVAL 10MM (BASKET)
SYS WOUND ALEXIS 18CM MED (MISCELLANEOUS)
SYSTEM BAG RETRIEVAL 10MM (BASKET) IMPLANT
SYSTEM WOUND ALEXIS 18CM MED (MISCELLANEOUS) IMPLANT
TOWEL OR NON WOVEN STRL DISP B (DISPOSABLE) IMPLANT
TRAP SPECIMEN MUCUS 40CC (MISCELLANEOUS) IMPLANT
TRAY FOLEY MTR SLVR 16FR STAT (SET/KITS/TRAYS/PACK) ×2 IMPLANT
TROCAR PORT AIRSEAL 5X120 (TROCAR) IMPLANT
UNDERPAD 30X36 HEAVY ABSORB (UNDERPADS AND DIAPERS) ×4 IMPLANT
WATER STERILE IRR 1000ML POUR (IV SOLUTION) ×2 IMPLANT
YANKAUER SUCT BULB TIP 10FT TU (MISCELLANEOUS) IMPLANT

## 2022-09-10 NOTE — Anesthesia Postprocedure Evaluation (Signed)
Anesthesia Post Note  Patient: Megan Haas  Procedure(s) Performed: XI ROBOTIC ASSISTED LAPAROSCOPIC HYSTERECTOMY AND BILATERAL SALPINGO OOPHORECTOMY (Bilateral) SENTINEL NODE BIOPSY     Patient location during evaluation: PACU Anesthesia Type: General Level of consciousness: awake and alert Pain management: pain level controlled Vital Signs Assessment: post-procedure vital signs reviewed and stable Respiratory status: spontaneous breathing, nonlabored ventilation, respiratory function stable and patient connected to nasal cannula oxygen Cardiovascular status: blood pressure returned to baseline and stable Postop Assessment: no apparent nausea or vomiting Anesthetic complications: no   No notable events documented.  Last Vitals:  Vitals:   09/10/22 1442 09/10/22 1445  BP: 106/76 (!) 101/52  Pulse: 95 87  Resp: 14 10  Temp: 36.6 C   SpO2: 96% 96%    Last Pain:  Vitals:   09/10/22 1454  TempSrc:   PainSc: [place]

## 2022-09-10 NOTE — Anesthesia Procedure Notes (Signed)
Procedure Name: Intubation Date/Time: 09/10/2022 11:40 AM  Performed by: Victoriano Lain, CRNAPre-anesthesia Checklist: Patient identified, Emergency Drugs available, Suction available, Patient being monitored and Timeout performed Patient Re-evaluated:Patient Re-evaluated prior to induction Oxygen Delivery Method: Circle system utilized Preoxygenation: Pre-oxygenation with 100% oxygen Induction Type: IV induction Laryngoscope Size: Mac and 4 Grade View: Grade I Tube type: Oral Tube size: 7.5 mm Number of attempts: 1 Airway Equipment and Method: Stylet Placement Confirmation: ETT inserted through vocal cords under direct vision, positive ETCO2 and breath sounds checked- equal and bilateral Secured at: 22 cm Tube secured with: Tape Dental Injury: Teeth and Oropharynx as per pre-operative assessment

## 2022-09-10 NOTE — Anesthesia Preprocedure Evaluation (Addendum)
Anesthesia Evaluation  Patient identified by MRN, date of birth, ID band Patient awake    Reviewed: Allergy & Precautions, NPO status , Patient's Chart, lab work & pertinent test results  History of Anesthesia Complications Negative for: history of anesthetic complications  Airway Mallampati: II  TM Distance: >3 FB Neck ROM: Full    Dental  (+) Dental Advisory Given, Teeth Intact   Pulmonary former smoker   Pulmonary exam normal        Cardiovascular negative cardio ROS Normal cardiovascular exam     Neuro/Psych  PSYCHIATRIC DISORDERS Anxiety Depression Bipolar Disorder   negative neurological ROS     GI/Hepatic negative GI ROS, Neg liver ROS,,,  Endo/Other  diabetes, Type 2  Morbid obesity  Renal/GU negative Renal ROS     Musculoskeletal  (+) Arthritis ,  Fibromyalgia -  Abdominal  (+) + obese  Peds  Hematology  (+) Blood dyscrasia, anemia   Anesthesia Other Findings On GLP-1a   Reproductive/Obstetrics  Uterine cancer                              Anesthesia Physical Anesthesia Plan  ASA: 3  Anesthesia Plan: General   Post-op Pain Management: Tylenol PO (pre-op)* and Celebrex PO (pre-op)*   Induction: Intravenous  PONV Risk Score and Plan: 3 and Treatment may vary due to age or medical condition, Ondansetron, Dexamethasone, Midazolam and Scopolamine patch - Pre-op  Airway Management Planned: Oral ETT  Additional Equipment: None  Intra-op Plan:   Post-operative Plan: Extubation in OR  Informed Consent: I have reviewed the patients History and Physical, chart, labs and discussed the procedure including the risks, benefits and alternatives for the proposed anesthesia with the patient or authorized representative who has indicated his/her understanding and acceptance.     Dental advisory given  Plan Discussed with: CRNA and Anesthesiologist  Anesthesia Plan Comments:         Anesthesia Quick Evaluation

## 2022-09-10 NOTE — Transfer of Care (Signed)
Immediate Anesthesia Transfer of Care Note  Patient: Megan Haas  Procedure(s) Performed: XI ROBOTIC ASSISTED LAPAROSCOPIC HYSTERECTOMY AND BILATERAL SALPINGO OOPHORECTOMY (Bilateral) SENTINEL NODE BIOPSY  Patient Location: PACU  Anesthesia Type:General  Level of Consciousness: awake, alert , oriented, and patient cooperative  Airway & Oxygen Therapy: Patient Spontanous Breathing and Patient connected to face mask oxygen  Post-op Assessment: Report given to RN, Post -op Vital signs reviewed and stable, and Patient moving all extremities X 4  Post vital signs: Reviewed and stable  Last Vitals:  Vitals Value Taken Time  BP 106/76 09/10/22 1442  Temp    Pulse 91 09/10/22 1443  Resp 18 09/10/22 1443  SpO2 97 % 09/10/22 1443  Vitals shown include unvalidated device data.  Last Pain:  Vitals:   09/10/22 1442  TempSrc:   PainSc: 4          Complications: No notable events documented.

## 2022-09-10 NOTE — Interval H&P Note (Signed)
History and Physical Interval Note:  09/10/2022 10:45 AM  Megan Haas  has presented today for surgery, with the diagnosis of ENDOMETRIAL CANCER.  The various methods of treatment have been discussed with the patient and family. After consideration of risks, benefits and other options for treatment, the patient has consented to  Procedure(s): XI ROBOTIC [place] (Bilateral) SENTINEL NODE BIOPSY (N/A) POSSIBLE LYMPH NODE DISSECTION (N/A) as a surgical intervention.  The patient's history has been reviewed, patient examined, no change in status, stable for surgery.  I have reviewed the patient's chart and labs.  Questions were answered to the patient's satisfaction.     [name]

## 2022-09-10 NOTE — Op Note (Signed)
OPERATIVE NOTE  Pre-operative Diagnosis: endometrial cancer grade 1  Post-operative Diagnosis: same  Operation: Robotic-assisted laparoscopic total hysterectomy with bilateral salpingoophorectomy, SLN biopsy, lysis of adhesions for approximately 10 minutes Extreme morbid obesity requiring additional OR personnel for positioning and retraction. Obesity made retroperitoneal visualization limited and increased the complexity of the case and necessitated additional instrumentation for retraction. Obesity related complexity increased the duration of the procedure by 45 minutes.   Surgeon: Jeral Pinch MD  Assistant Surgeon: Joylene John NP  Anesthesia: GET  Urine Output: 800 cc  Operative Findings: On EUA, mobile uterus. On intra-abdominal entry, normal upper abdominal survey. Loop of duodenum and stomach adherent to the anterior abdominal wall. Some omentum adherent to the anterior abdominal wall just above the umbilicus. Uterus 10 cm and normal in appearance. Normal bilateral tubes and ovaries. Mapping successful to bilateral obturator SLNs. On the left, two separate channels noted with SLNs in the obturator basin. No obvious signs of metastatic disease. Nabothian cyst in the posterior cervix on the left.   Estimated Blood Loss:  75 cc      Total IV Fluids: see I&O flowsheet         Specimens: uterus, cervix, bilateral tubes and ovaries, bilateral obturator SLNs (2 on left, 1 on right)         Complications:  None apparent; patient tolerated the procedure well.         Disposition: PACU - hemodynamically stable.  Procedure Details  The patient was seen in the Holding Room. The risks, benefits, complications, treatment options, and expected outcomes were discussed with the patient.  The patient concurred with the proposed plan, giving informed consent.  The site of surgery properly noted/marked. The patient was identified as Garment/textile technologist and the procedure verified as a  Robotic-assisted hysterectomy with bilateral salpingo oophorectomy with SLN biopsy.   After induction of anesthesia, the patient was draped and prepped in the usual sterile manner. Patient was placed in supine position after anesthesia and draped and prepped in the usual sterile manner as follows: Her arms were tucked to her side with all appropriate precautions.  The patient was secured to the bed using padding and tape across her chest.  The patient was placed in the semi-lithotomy position in [place].  The perineum and vagina were prepped with Chlorhexidene. The patient's abdomen was prepped with ChloraPrep and she was draped after the prep had been allowed to dry for 3 minutes.  A Time Out was held and the above information confirmed.  The urethra was prepped with Betadine. Foley catheter was placed.  A sterile speculum was placed in the vagina.  The cervix was grasped with a single-tooth tenaculum. 67m total of ICG was injected into the cervical stroma at 2 and 9 o'clock with 1cc injected at a 1cm and 219mdepth (concentration 0.62m37ml) in all locations. The cervix was dilated with PraKennon Roundslators.  The 3.5 Delineator uterine manipulator with a colpotomizer ring was placed without difficulty.  A pneum occluder balloon was placed over the manipulator.  OG tube placement was confirmed and to suction.   Next, a 10 mm skin incision was made 5-6 cm below the subcostal margin in the midclavicular line and away from prior robotic incisions.  The 5 mm Optiview port and scope was used for direct entry.  Opening pressure was under 10 mm CO2.  The abdomen was insufflated and the findings were noted as above.   At this point and all points during the procedure, the  patient's intra-abdominal pressure did not exceed 15 mmHg. Next, an 8 mm skin incision was made superior to the umbilicus and a right and left port were placed about 8 cm lateral to the robot port on the right and left side.  A fourth arm was placed  on the right.  The 5 mm assist trocar was exchanged for a 10-12 mm port. All ports were placed under direct visualization.  The patient was placed in steep Trendelenburg.  The robot was docked in the normal manner.  Monopolar electrocautery was used to lyse adhesions of the omentum to the anterior abdominal wall. The omentum and small bowel were then moved into the upper abdomen.   The right and left peritoneum were opened parallel to the IP ligament to open the retroperitoneal spaces bilaterally. The round ligaments were transected. The SLN mapping was performed in bilateral pelvic basins. After identifying the ureters, the para rectal and paravesical spaces were opened up entirely with careful dissection below the level of the ureters bilaterally and to the depth of the uterine artery origin in order to skeletonize the uterine "web" and ensure visualization of all parametrial channels. The para-aortic basins were carefully exposed and evaluated for isolated para-aortic SLN's. Lymphatic channels were identified travelling to the following visualized sentinel lymph node's: bilateral obturator SLNs. On the left, there were two separate channels with two distinct SLNs in the obturator basin. These SLN's were separated from their surrounding lymphatic tissue, removed and sent for permanent pathology.  The hysterectomy was started.  The ureter was again noted to be on the medial leaf of the broad ligament.  The peritoneum above the ureter was incised and stretched and the infundibulopelvic ligament was skeletonized, cauterized and cut.  The posterior peritoneum was taken down to the level of the KOH ring.  The anterior peritoneum was also taken down.  The bladder flap was created to the level of the KOH ring.  The uterine artery on the right side was skeletonized, cauterized and cut in the normal manner.  A similar procedure was performed on the left.  The colpotomy was made and the uterus, cervix, bilateral  ovaries and tubes were amputated and delivered through the vagina.  Upon examination, a small portion of cervix appeared to be in situ along the posterior left vaginal cuff. This was excised and handed out through the vagina. Pedicles were inspected and excellent hemostasis was achieved.    The colpotomy at the vaginal cuff was closed with 0 Vicryl using a figure of eight at each apex and 0 V-Loc to close the midportion of the cuff in running manner.  Irrigation was used and excellent hemostasis was achieved.  At this point in the procedure was completed.  Robotic instruments were removed under direct visulaization.  The robot was undocked. The fascia at the 10-12 mm port was closed with 0 Vicryl using a PMI fascial closure device under direct visualization.  The subcuticular tissue was closed with 4-0 Vicryl and the skin was closed with 4-0 Monocryl in a subcuticular manner.  Dermabond was applied.    The vagina was swabbed with  minimal bleeding noted. Foley catheter was removed  All sponge, lap and needle counts were correct x  3.   The patient was transferred to the recovery room in stable condition.  Jeral Pinch, MD

## 2022-09-10 NOTE — Discharge Instructions (Addendum)
AFTER SURGERY INSTRUCTIONS   Return to work: 4-6 weeks if applicable  Today, Dr. Berline Lopes removed the uterus, cervix, fallopian tubes, ovaries, and sentinel lymph nodes on the right and left pelvis (3 total)   Activity: 1. Be up and out of the bed during the day.  Take a nap if needed.  You may walk up steps but be careful and use the hand rail.  Stair climbing will tire you more than you think, you may need to stop part way and rest.    2. No lifting or straining for 6 weeks over 10 pounds. No pushing, pulling, straining for 6 weeks.   3. No driving for around 1 week(s).  Do not drive if you are taking narcotic pain medicine and make sure that your reaction time has returned.    4. You can shower as soon as the next day after surgery. Shower daily.  Use your regular soap and water (not directly on the incision) and pat your incision(s) dry afterwards; don't rub.  No tub baths or submerging your body in water until cleared by your surgeon. If you have the soap that was given to you by pre-surgical testing that was used before surgery, you do not need to use it afterwards because this can irritate your incisions.    5. No sexual activity and nothing in the vagina for 10-12 weeks.   6. You may experience a small amount of clear drainage from your incisions, which is normal.  If the drainage persists, increases, or changes color please call the office.   7. Do not use creams, lotions, or ointments such as neosporin on your incisions after surgery until advised by your surgeon because they can cause removal of the dermabond glue on your incisions.     8. You may experience vaginal spotting after surgery or around the 6-8 week mark from surgery when the stitches at the top of the vagina begin to dissolve.  The spotting is normal but if you experience heavy bleeding, call our office.   9. Take Tylenol or ibuprofen first for pain if you are able to take these medications and only use narcotic pain  medication for severe pain not relieved by the Tylenol or Ibuprofen.  Monitor your Tylenol intake to a max of 4,000 mg in a 24 hour period. You can alternate these medications after surgery.   Diet: 1. Low sodium Heart Healthy Diet is recommended but you are cleared to resume your normal (before surgery) diet after your procedure.   2. It is safe to use a laxative, such as Miralax or Colace, if you have difficulty moving your bowels. You have been prescribed Sennakot-S to take at bedtime every evening after surgery to keep bowel movements regular and to prevent constipation.     Wound Care: 1. Keep clean and dry.  Shower daily.   Reasons to call the Doctor: Fever - Oral temperature greater than 100.4 degrees Fahrenheit Foul-smelling vaginal discharge Difficulty urinating Nausea and vomiting Increased pain at the site of the incision that is unrelieved with pain medicine. Difficulty breathing with or without chest pain New calf pain especially if only on one side Sudden, continuing increased vaginal bleeding with or without clots.   Contacts: For questions or concerns you should contact:   Dr. Jeral Pinch at [phone]   Joylene John, NP at [phone]   After Hours: call [phone] and have the GYN Oncologist paged/contacted (after 5 pm or on the weekends).   Messages sent  via mychart are for non-urgent matters and are not responded to after hours so for urgent needs, please call the after hours number.

## 2022-09-11 ENCOUNTER — Telehealth: Payer: Self-pay

## 2022-09-11 ENCOUNTER — Encounter (HOSPITAL_COMMUNITY): Payer: Self-pay | Admitting: Gynecologic Oncology

## 2022-09-11 NOTE — Telephone Encounter (Signed)
Spoke with Ms. Gros this morning. She states she is eating, drinking and urinating well. She has not had a BM. She has not passed gas. She is taking senokot as prescribed and encouraged her to drink plenty of water.  She also has taken 1 capful of Miralax last night. Instructed her to take a capful bid with Senokot-S until good BM. Instructed her to call if she develops vomiting and or severe abdominal pain.  She denies fever or chills. Incisions are dry and intact. She rates her pain 3/10. Her pain is controlled with Tylenol 1000 mg alternating with 400 mg of Ibuprofen.     Instructed to call office with any fever, chills, purulent drainage, uncontrolled pain or any other questions or concerns. Patient verbalizes understanding.   Pt aware of post op appointments as well as the office number [phone] and after hours number [phone] to call if she has any questions or concerns

## 2022-09-13 ENCOUNTER — Telehealth: Payer: Self-pay | Admitting: Psychiatry

## 2022-09-13 NOTE — Telephone Encounter (Signed)
Called pt regarding nurse triage line call for pain. Pt taking scheduled tylenol and motrin which helps but pain not getting below a 5-6. No n/v. Having BM/flatus. Able to get up and move around. Using ice pack and rest help. Taking tramadol 76m 2 -3 times a day. Discussed increasing tramdol to 1087mcan take q6hr. Will have clinic call and check in in the morning. If this is working, we may be able to send in a few more tabs. Or can consider switching to oxycodone or have pt come for eval. Otherwise reasons to present to ED overnight reviewed. All questions answered.

## 2022-09-14 ENCOUNTER — Telehealth: Payer: Self-pay

## 2022-09-14 NOTE — Telephone Encounter (Signed)
Called patient to evaluate pain level. Patient reports sleeping well last night after taking 100 mg of Tramadol at bedtime. On awakening, pain rated at 4/10. She states she is comfortable with the change to 100 mg Q 6hrs PRN. She has 8 tablets remaining. She will transition to back to tylenol and motrin when she is out of Ultram or sooner if able.

## 2022-09-15 ENCOUNTER — Encounter: Payer: Self-pay | Admitting: Gynecologic Oncology

## 2022-09-15 ENCOUNTER — Inpatient Hospital Stay (HOSPITAL_BASED_OUTPATIENT_CLINIC_OR_DEPARTMENT_OTHER): Payer: Medicare Other | Admitting: Gynecologic Oncology

## 2022-09-15 DIAGNOSIS — Z90722 Acquired absence of ovaries, bilateral: Secondary | ICD-10-CM

## 2022-09-15 DIAGNOSIS — Z90710 Acquired absence of both cervix and uterus: Secondary | ICD-10-CM

## 2022-09-15 DIAGNOSIS — C541 Malignant neoplasm of endometrium: Secondary | ICD-10-CM

## 2022-09-15 DIAGNOSIS — Z7189 Other specified counseling: Secondary | ICD-10-CM

## 2022-09-15 LAB — SURGICAL PATHOLOGY

## 2022-09-15 NOTE — Progress Notes (Signed)
Gynecologic Oncology Telehealth Note: Gyn-Onc  I connected with Megan Haas on 09/15/22 at 12:30 AM EST by telephone and verified that I am speaking with the correct person using two identifiers.  I discussed the limitations, risks, security and privacy concerns of performing an evaluation and management service by telemedicine and the availability of in-person appointments. I also discussed with the patient that there may be a patient responsible charge related to this service. The patient expressed understanding and agreed to proceed.  Other persons participating in the visit and their role in the encounter: spouse.  Patient's location: home Provider's location: [place]  Reason for Visit: follow-up after surgery  Treatment History: The patient endorses a longstanding history of irregular menses.  She describes this as not being sure when her menses will start, often skipping 1 or 2 months.  Her bleeding may be light or heavy.  Menses typically last 4-5 days.  A month before she saw her OB/GYN, she had bleeding on and off for 3 weeks.  She endorses about a 4 to 58-monthhistory of occasional abdominal pain, sometimes upper abdominal pain and sometimes lower abdominal pain.   She was seen by her OB/GYN earlier this month.  At the time of that visit, 2 or 3 larger cervical polyps were seen on exam, partially evulsed. Cervical polyp biopsy: 07/30/2022 reveals well-differentiated invasive adenocarcinoma.  IHC positive for p16, ER and PR.  Negative for p53.  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   08/27/22: Hysteroscopy with endometrial curettings, cervical biopsies, endocervical curettage.  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  This is seen in both the prolapsing endometrial polyp as well as the endometrial curettings.  Cervical biopsies show  benign endocervical mucosa, no dysplasia or malignancy.  Endocervical curettage also negative for dysplasia or malignancy.   08/27/22: PET 1. Increased radiotracer uptake localizing to the endometrium compatible with known endometrial adenocarcinoma. 2. No signs of tracer avid retroperitoneal or pelvic lymph nodes. 3. 1.6 cm FDG avid central mesenteric lymph node is identified has an SUV max of 6.38. This is of uncertain clinical significance, and in the absence of retroperitoneal or pelvic adenopathy this would be an unusual location for nodal metastasis. Close interval follow-up is advised. 4. There is a borderline enlarged right inguinal lymph node with mild tracer uptake. This is nonspecific and may be reactive. 5. Two small nonspecific pulmonary nodules are identified measuring up to 5 mm. These are technically too small to characterize by PET-CT. Attention in these nodules on future surveillance imaging is recommended. 6. Hepatic steatosis.  09/10/22: TRH/BSO, bilateral SLN biopsy, LOA. No MI. NO LVI. P53 WT.   Interval History: Doing well. Pain improving. + flatus and bowel movements. Denies urinary symptoms. Denies vaginal bleeding  or discharge. Tolerating PO without nausea or emesis.   Past Medical/Surgical History: Past Medical History:  Diagnosis Date   [place]   uterus   Anemia    Follows w/ Hematology, KRichrd Sox NP @ [facility].S/p 2 iron infusions in 2023.   Anxiety    Arthritis    back   [place] 1 [place]    [facility] [facility]/ Dr. ARonney [email or web address], depression, and anxiety.   COVID-19 07/2020   Depression    Diabetes mellitus without complication ([place]    Type 2. Pt is taking Ozempic & follows w/ [facility].  Fibromyalgia    Follows w/ PCP, Burr Medico, NP @ [facility].   History of sleep study    Pt stated she had a sleep study done in 2023 and was told she does not have sleep apnea.   Obesity, Class III,  BMI 40-49.9 (morbid obesity) ([place])    Follows with [facility].   PTSD (post-traumatic stress disorder)    hx of   Wears glasses    for distance    Past Surgical History:  Procedure Laterality Date   CERVICAL CONIZATION W/BX N/A 08/27/2022   Procedure: CERVIAL BIOPSIES AND ENDOCERVICAL CURRETTAGE;  Surgeon: [name], MD;  Location: [facility];  Service: Gynecology;  Laterality: N/A;   CHOLECYSTECTOMY  2006   COLONOSCOPY W/ POLYPECTOMY  03/26/2021   multiple colon polyps   DILATATION & CURETTAGE/HYSTEROSCOPY WITH MYOSURE N/A 08/27/2022   Procedure: DILATATION & CURETTAGE/HYSTEROSCOPY WITH MYOSURE;  Surgeon: [name], MD;  Location[facility];  Service: Gynecology;  Laterality: N/A;   ESOPHAGOGASTRODUODENOSCOPY  08/18/2018   GASTROPLASTY DUODENAL SWITCH  01/2019   robotically   ROBOTIC ASSISTED LAPAROSCOPIC HYSTERECTOMY AND SALPINGECTOMY Bilateral 09/10/2022   Procedure: XI ROBOTIC ASSISTED LAPAROSCOPIC HYSTERECTOMY AND BILATERAL SALPINGO OOPHORECTOMY;  Surgeon: [name], MD;  Location: [facility];  Service: Gynecology;  Laterality: Bilateral;   SENTINEL NODE BIOPSY N/A 09/10/2022   Procedure: SENTINEL NODE BIOPSY;  Surgeon: [name], MD;  Location: [facility];  Service: Gynecology;  Laterality: N/A;    Family History  Problem Relation Age of Onset   Diabetes Father    Prostate cancer Father    Hypertension Maternal Aunt    Diabetes Maternal Uncle    Hypertension Maternal Grandmother    Diabetes Paternal Grandmother    Colon cancer Neg Hx    Stomach cancer Neg Hx    Pancreatic cancer Neg Hx    Esophageal cancer Neg Hx    Breast cancer Neg Hx    Ovarian cancer Neg Hx    Endometrial cancer Neg Hx     Social History   Socioeconomic History   Marital status: Married    Spouse name: Not on file   Number of children: Not on file   Years of education: Not on file   Highest education level: Not on file   Occupational History   Not on file  Tobacco Use   Smoking status: Former    Packs/day: 0.75    Years: 10.00    Total pack years: 7.50    Types: Cigarettes    Quit date: 2004    Years since quitting: 20.1   Smokeless tobacco: Never  Vaping Use   Vaping Use: Never used  Substance and Sexual Activity   Alcohol use: Yes    Alcohol/week: 1.0 standard drink of alcohol    Types: 1 Standard drinks or equivalent per week    Comment: occasional   Drug use: Not Currently    Comment: 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, pain killers, marijuana, acid   Sexual activity: Yes    Birth control/protection: None  Other Topics Concern   Not on file  Social History Narrative   Not on file   Social Determinants of Health   Financial Resource Strain: Not on file  Food Insecurity: Not on file  Transportation Needs: Not on file  Physical Activity: Not on file  Stress: Not on file  Social Connections: Not on file    Current Medications:  Current  Outpatient Medications:    acetaminophen (TYLENOL) 500 MG tablet, Take 1 tablet by mouth daily as needed., Disp: , Rfl:    brexpiprazole (REXULTI) 2 MG TABS tablet, Take 2 mg by mouth in the morning., Disp: , Rfl:    buPROPion (WELLBUTRIN XL) 150 MG 24 hr tablet, Take 450 mg by mouth at bedtime., Disp: , Rfl:    Calcium Carb-Cholecalciferol (CALCIUM 500/D) 500-10 MG-MCG CHEW, Chew 1 tablet by mouth in the morning, at noon, in the evening, and at bedtime., Disp: , Rfl:    dexmethylphenidate (FOCALIN) 10 MG tablet, Take 10 mg by mouth 2 (two) times daily., Disp: , Rfl:    dexmethylphenidate (FOCALIN) 5 MG tablet, Take 5 mg by mouth daily in the afternoon. 5 mg + 10 mg=15 mg total in the afternoon, Disp: , Rfl:    gabapentin (NEURONTIN) 400 MG capsule, Take 400-800 mg by mouth See admin instructions. Take 1 capsule (400 mg) by mouth in the morning & take 2 capsules (800 mg) by mouth at bedtime., Disp: , Rfl:    ipratropium (ATROVENT) 0.03 % nasal spray, Place 2  sprays into both nostrils 2 (two) times daily as needed (allergies.)., Disp: , Rfl:    Multiple Vitamins-Minerals (MULTIVITAMIN WITH MINERALS) tablet, Take 1 tablet by mouth in the morning., Disp: , Rfl:    OZEMPIC, 2 MG/DOSE, 8 MG/3ML SOPN, Inject 1 mg into the skin every Thursday., Disp: , Rfl:    senna-docusate (SENOKOT-S) 8.6-50 MG tablet, Take 2 tablets by mouth at bedtime. For AFTER surgery, do not take if having diarrhea, Disp: 30 tablet, Rfl: 0   traMADol (ULTRAM) 50 MG tablet, Take 1 tablet (50 mg total) by mouth every 6 (six) hours as needed for severe pain. For AFTER surgery only, do not take and drive, Disp: 10 tablet, Rfl: 0   VIIBRYD 40 MG TABS, Take 40 mg by mouth in the morning., Disp: , Rfl: 1  Review of Symptoms: Pertinent positives as per HPI.  Physical Exam: Deferred given limitations of phone visit.  Laboratory & Radiologic Studies: A. LYMPH NODE, SENTINEL, RIGHT OBTURATOR, EXCISION: One lymph node negative for metastatic carcinoma (0/1)  B. LYMPH NODE, SENTINEL, LEFT OBTURATOR, EXCISION: Two lymph nodes negative for metastatic carcinoma (0/2)  C. UTERUS, CERVIX, BILATERAL FALLOPIAN TUBES AND OVARIES: Endometrium     Well-differentiated endometrioid adenocarcinoma associated with extensive endometrial Intraepithelial Neoplasia/Atypical hyperplasia (EIN/AH)     Negative for myometrial invasion     p53 wild-type pattern (nonmutated)     All margins negative for carcinoma     See oncology table Cervix     Nabothian cyst     Negative for dysplasia or malignancy Myometrium     Unremarkable     Negative for malignancy Right ovary     Benign follicle cyst     Negative for endometriosis or malignancy Left ovary     Mucinous adenofibroma with focal atypia     See comment Right fallopian tube     Benign paratubal cyst     Negative for endometriosis or malignancy Left fallopian tube     Unremarkable     Negative for endometriosis or malignancy  D. CERVIX,  POSTERIOR, BIOPSY: Benign cervical mucosa Negative for dysplasia or malignancy  ONCOLOGY TABLE: UTERUS, CARCINOMA OR CARCINOSARCOMA: Resection Procedure: Total hysterectomy, bilateral salpingo-oophorectomy and right and left obturator sentinel lymph nodes Histologic Type: Endometrioid Histologic Grade: FIGO grade 1 Myometrial Invasion:      Depth of Myometrial Invasion (mm): 0 mm  Myometrial Thickness (mm): 30 mm      Percentage of Myometrial Invasion: 0% Uterine Serosa Involvement: Not identified Cervical stromal Involvement: Not identified Extent of involvement of other tissue/organs: Not identified Peritoneal/Ascitic Fluid: Not applicable Lymphovascular Invasion: Not identified Regional Lymph Nodes:      Pelvic Lymph Nodes Examined:          3 Sentinel          0 non-sentinel          3 total      Pelvic Lymph Nodes with Metastasis: 0          Macrometastasis: (>2.0 mm): 0          Micrometastasis: (>0.2 mm and < 2.0 mm): 0          Isolated Tumor Cells (<0.2 mm): 0          Laterality of Lymph Node with Tumor: Not applicable          Extracapsular Extension: Not applicable Distant Metastasis:      Distant Site(s) Involved: Not applicable Pathologic Stage Classification (pTNM, AJCC 8th Edition): pT1a, pN0 Ancillary Studies: MMR and p53 will be performed Representative Tumor Block: C10 Comment(s): The left ovary has a mucinous adenofibroma with microscopic foci of complex papillary tufting with minimal associated cytologic atypia.  The entire left ovary is examined histologically and no invasive carcinoma is identified. Immunohistochemistry for cytokeratin AE1/AE3 performed on the sentinel lymph nodes (parts A and B) is negative. Immunohistochemistry for p53 shows decreased expression in a wild-type pattern (nonmutated).    Assessment & Plan: Megan Haas is a 47 y.o. woman with Stage IA1 grade 1 endometrial cancer who presents for phone follow-up. P53 WT,  MMR not resulted.  Doing well, meeting milestones. Discussed continued expectations. Reviewed pathology from surgery. She is very happy with results.   No menopausal symptoms yet. Patient would like to wait until in person visit to assess menopausal symptoms, discuss consideration of HRT.  I discussed the assessment and treatment plan with the patient. The patient was provided with an opportunity to ask questions and all were answered. The patient agreed with the plan and demonstrated an understanding of the instructions.   The patient was advised to call back or see an in-person evaluation if the symptoms worsen or if the condition fails to improve as anticipated.   8 minutes of total time was spent for this patient encounter, including preparation, phone counseling with the patient and coordination of care, and documentation of the encounter.   Jeral Pinch, MD  Division of Gynecologic Oncology  Department of Obstetrics and Gynecology  [facility]

## 2022-09-16 ENCOUNTER — Telehealth: Payer: Self-pay

## 2022-09-16 ENCOUNTER — Inpatient Hospital Stay: Payer: Medicare Other | Admitting: Gynecologic Oncology

## 2022-09-16 NOTE — Telephone Encounter (Signed)
Pt called stating she has noticed this morning some swelling on her right lower abdomen area. No pain/bruising/redness.Not warm to touch.  No fever/chills. Normal BM's/passing gas. There is no incision in this area from her recent surgery. She isn't really concerned but just thought she would mention it.   Advised her to keep an eye on it, watch for above S&S. Stay hydrated and Take Tylenol for swelling and can use a low/med heating pad.She will call if anything gets worse.  Dr. Berline Lopes notified

## 2022-09-18 ENCOUNTER — Encounter: Payer: Medicare Other | Admitting: Gynecologic Oncology

## 2022-09-22 ENCOUNTER — Telehealth: Payer: Self-pay | Admitting: *Deleted

## 2022-09-22 NOTE — Telephone Encounter (Signed)
Thanks, Dr Murtis Sink with the patient and explained the above message per Dr Berline Lopes. Patient verbalized understanding

## 2022-09-22 NOTE — Telephone Encounter (Signed)
Patient called and stated "My glue on my incisions from surgery is flaking off. I had surgery surgery on 2/15. Dr Berline Lopes did say the glue would do this. Is this the correct time for that? I My incisions are clear and closed. They are not red or warm to the touch; and no drainage." Explained that the flaking is normal but the message would be sent to Dr Chancy Hurter APP and the office would call her back.

## 2022-09-29 ENCOUNTER — Encounter: Payer: Self-pay | Admitting: Gynecologic Oncology

## 2022-10-08 ENCOUNTER — Encounter: Payer: Self-pay | Admitting: Gynecologic Oncology

## 2022-10-08 ENCOUNTER — Other Ambulatory Visit: Payer: Self-pay

## 2022-10-08 ENCOUNTER — Inpatient Hospital Stay: Payer: Medicare Other | Attending: Gynecologic Oncology | Admitting: Gynecologic Oncology

## 2022-10-08 VITALS — BP 125/75 | HR 91 | Temp 98.3°F | Resp 14 | Ht 65.0 in | Wt 290.0 lb

## 2022-10-08 DIAGNOSIS — E8941 Symptomatic postprocedural ovarian failure: Secondary | ICD-10-CM

## 2022-10-08 DIAGNOSIS — Z7189 Other specified counseling: Secondary | ICD-10-CM

## 2022-10-08 DIAGNOSIS — C541 Malignant neoplasm of endometrium: Secondary | ICD-10-CM

## 2022-10-08 DIAGNOSIS — N951 Menopausal and female climacteric states: Secondary | ICD-10-CM

## 2022-10-08 NOTE — Patient Instructions (Signed)
It was good to see you today.  You are healing very well from surgery.  Please remember, no heavy lifting for at least 6 weeks after surgery, no soaking in water for 6-8 weeks, and nothing in the vagina for at least 10 weeks.  As we discussed today, we will plan on follow-up visits every 6 months alternating between my office and your OB/GYN.  I will plan to see you back for your first visit in September.  Please call in June or July to get this visit scheduled.  If you develop any of the symptoms we discussed today including vaginal bleeding or discharge, pelvic pain, change to bowel function, or unintentional weight loss, please call to see me sooner.

## 2022-10-08 NOTE — Progress Notes (Signed)
Gynecologic Oncology Return Clinic Visit  10/08/22  Reason for Visit: follow-up after surgery  Treatment History: Oncology History Overview Note  P53 WT, MMR intact   Endometrial cancer ([place])  09/10/2022 Initial Diagnosis   Endometrial cancer ([place])    The patient endorses a longstanding history of irregular menses.  She describes this as not being sure when her menses will start, often skipping 1 or 2 months.  Her bleeding may be light or heavy.  Menses typically last 4-5 days.  A month before she saw her OB/GYN, she had bleeding on and off for 3 weeks.  She endorses about a 4 to 62-monthhistory of occasional abdominal pain, sometimes upper abdominal pain and sometimes lower abdominal pain.   She was seen by her OB/GYN earlier this month.  At the time of that visit, 2 or 3 larger cervical polyps were seen on exam, partially evulsed. Cervical polyp biopsy: 07/30/2022 reveals well-differentiated invasive adenocarcinoma.  IHC positive for p16, ER and PR.  Negative for p53.  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   08/27/22: Hysteroscopy with endometrial curettings, cervical biopsies, endocervical curettage.  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  This is seen in both the prolapsing endometrial polyp as well as the endometrial curettings.  Cervical biopsies show benign endocervical mucosa, no dysplasia or malignancy.  Endocervical curettage also negative for dysplasia or malignancy.   08/27/22: PET 1. Increased radiotracer uptake localizing to the endometrium compatible with known endometrial adenocarcinoma. 2. No signs of tracer avid retroperitoneal or pelvic lymph nodes. 3. 1.6 cm FDG avid central mesenteric lymph node is identified has an SUV max of 6.38. This is of uncertain clinical significance, and in the absence of retroperitoneal or  pelvic adenopathy this would be an unusual location for nodal metastasis. Close interval follow-up is advised. 4. There is a borderline enlarged right inguinal lymph node with mild tracer uptake. This is nonspecific and may be reactive. 5. Two small nonspecific pulmonary nodules are identified measuring up to 5 mm. These are technically too small to characterize by PET-CT. Attention in these nodules on future surveillance imaging is recommended. 6. Hepatic steatosis.   09/10/22: TRH/BSO, bilateral SLN biopsy, LOA. Stage IA1 low-grade endometrioid. No MI. NO LVI. P53 WT.   Interval History: Patient reports overall doing well.  She denies any abdominal pain.  Is intermittently aware of some discomfort in the area of the vaginal cuff, denies needing to take any medications for this.  Denies any vaginal bleeding or discharge.  Had some diarrhea initially with taking Senokot, which has resolved.  Denies any urinary symptoms.  Past Medical/Surgical History: Past Medical History:  Diagnosis Date   Adenocarcinoma ([place] 07/[place]   uterus   Anemia    Follows w/ Hematology, KRichrd Sox NP @ [facility].S/p 2 iron infusions in 2023.   Anxiety    Arthritis    back   Bipolar 1 disorder ([place]    [place] [email or web address], and anxiety.   COVID-19 07/2020   Depression    Diabetes mellitus without complication ([place]    Type 2. Pt is taking Ozempic & follows w/ [facility].   Fibromyalgia    Follows w/ PCP, KBurr Medico NP @ [facility].   History of sleep study    Pt stated she had a sleep study done in 2023 and  was told she does not have sleep apnea.   Obesity, Class III, BMI 40-49.9 (morbid obesity) ([place])    Follows with [facility].   PTSD (post-traumatic stress disorder)    hx of   Wears glasses    for distance    Past Surgical History:  Procedure Laterality Date   CERVICAL CONIZATION W/BX N/A 08/27/2022   Procedure: CERVIAL  BIOPSIES AND ENDOCERVICAL CURRETTAGE;  Surgeon: [name], MD;  Location: [facility];  Service: Gynecology;  Laterality: N/A;   CHOLECYSTECTOMY  2006   COLONOSCOPY W/ POLYPECTOMY  03/26/2021   multiple colon polyps   DILATATION & CURETTAGE/HYSTEROSCOPY WITH MYOSURE N/A 08/27/2022   Procedure: DILATATION & CURETTAGE/HYSTEROSCOPY WITH MYOSURE;  Surgeon: [name], MD;  Location: [facility];  Service: Gynecology;  Laterality: N/A;   ESOPHAGOGASTRODUODENOSCOPY  08/18/2018   GASTROPLASTY DUODENAL SWITCH  01/2019   robotically   ROBOTIC ASSISTED LAPAROSCOPIC HYSTERECTOMY AND SALPINGECTOMY Bilateral 09/10/2022   Procedure: XI ROBOTIC ASSISTED LAPAROSCOPIC HYSTERECTOMY AND BILATERAL SALPINGO OOPHORECTOMY;  Surgeon: [name], MD;  Location: [facility];  Service: Gynecology;  Laterality: Bilateral;   SENTINEL NODE BIOPSY N/A 09/10/2022   Procedure: SENTINEL NODE BIOPSY;  Surgeon: [name], MD;  Location: [facility];  Service: Gynecology;  Laterality: N/A;    Family History  Problem Relation Age of Onset   Diabetes Father    Prostate cancer Father    Hypertension Maternal Aunt    Diabetes Maternal Uncle    Hypertension Maternal Grandmother    Diabetes Paternal Grandmother    Colon cancer Neg Hx    Stomach cancer Neg Hx    Pancreatic cancer Neg Hx    Esophageal cancer Neg Hx    Breast cancer Neg Hx    Ovarian cancer Neg Hx    Endometrial cancer Neg Hx     Social History   Socioeconomic History   Marital status: Married    Spouse name: Not on file   Number of children: Not on file   Years of education: Not on file   Highest education level: Not on file  Occupational History   Not on file  Tobacco Use   Smoking status: Former    Packs/day: 0.75    Years: 10.00    Additional pack years: 0.00    Total pack years: 7.50    Types: Cigarettes    Quit date: 2004    Years since quitting: 20.2   Smokeless tobacco: Never   Vaping Use   Vaping Use: Never used  Substance and Sexual Activity   Alcohol use: Yes    Alcohol/week: 1.0 standard drink of alcohol    Types: 1 Standard drinks or equivalent per week    Comment: occasional   Drug use: Not Currently    Comment: 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, pain killers, marijuana, acid   Sexual activity: Yes    Birth control/protection: None  Other Topics Concern   Not on file  Social History Narrative   Not on file   Social Determinants of Health   Financial Resource Strain: Not on file  Food Insecurity: Not on file  Transportation Needs: Not on file  Physical Activity: Not on file  Stress: Not on file  Social Connections: Not on file    Current Medications:  Current Outpatient Medications:    acetaminophen (TYLENOL) 500 MG tablet, Take 1 tablet by mouth daily as needed., Disp: , Rfl:    brexpiprazole (REXULTI) 2  MG TABS tablet, Take 2 mg by mouth in the morning., Disp: , Rfl:    buPROPion (WELLBUTRIN XL) 150 MG 24 hr tablet, Take 450 mg by mouth at bedtime., Disp: , Rfl:    Calcium Carb-Cholecalciferol (CALCIUM 500/D) 500-10 MG-MCG CHEW, Chew 1 tablet by mouth in the morning, at noon, in the evening, and at bedtime., Disp: , Rfl:    dexmethylphenidate (FOCALIN) 10 MG tablet, Take 10 mg by mouth 2 (two) times daily., Disp: , Rfl:    dexmethylphenidate (FOCALIN) 5 MG tablet, Take 5 mg by mouth daily in the afternoon. 5 mg + 10 mg=15 mg total in the afternoon, Disp: , Rfl:    gabapentin (NEURONTIN) 400 MG capsule, Take 400-800 mg by mouth See admin instructions. Take 1 capsule (400 mg) by mouth in the morning & take 2 capsules (800 mg) by mouth at bedtime., Disp: , Rfl:    ipratropium (ATROVENT) 0.03 % nasal spray, Place 2 sprays into both nostrils 2 (two) times daily as needed (allergies.)., Disp: , Rfl:    Multiple Vitamins-Minerals (MULTIVITAMIN WITH MINERALS) tablet, Take 1 tablet by mouth in the morning., Disp: , Rfl:    OZEMPIC, 2 MG/DOSE, 8 MG/3ML SOPN,  Inject 1 mg into the skin every Thursday., Disp: , Rfl:    VIIBRYD 40 MG TABS, Take 40 mg by mouth in the morning., Disp: , Rfl: 1  Review of Systems: + pelvic pain and back pain Denies appetite changes, fevers, chills, fatigue, unexplained weight changes. Denies hearing loss, neck lumps or masses, mouth sores, ringing in ears or voice changes. Denies cough or wheezing.  Denies shortness of breath. Denies chest pain or palpitations. Denies leg swelling. Denies abdominal distention, pain, blood in stools, constipation, diarrhea, nausea, vomiting, or early satiety. Denies pain with intercourse, dysuria, frequency, hematuria or incontinence. Denies hot flashes, vaginal bleeding or vaginal discharge.   Denies joint pain or muscle pain/cramps. Denies itching, rash, or wounds. Denies dizziness, headaches, numbness or seizures. Denies swollen lymph nodes or glands, denies easy bruising or bleeding. Denies anxiety, depression, confusion, or decreased concentration.  Physical Exam: BP 125/75 (BP Location: Left Arm, Patient Position: Sitting)   Pulse 91   Temp 98.3 F (36.8 C) (Oral)   Resp 14   Ht '5\' 5"'$  (1.651 m)   Wt 290 lb (131.5 kg)   SpO2 96%   BMI 48.26 kg/m  General: Alert, oriented, no acute distress. HEENT: Normocephalic, atraumatic, sclera anicteric. Chest: Unlabored breathing on room air. Abdomen: Obese, soft, nontender.  Normoactive bowel sounds.  No masses or hepatosplenomegaly appreciated.  Well-healed incisions. Extremities: Grossly normal range of motion.  Warm, well perfused.  No edema bilaterally. GU: Normal appearing external genitalia without erythema, excoriation, or lesions.  Speculum exam reveals cuff intact, suture visible, no blood or discharge within the vaginal vault.  Bimanual exam reveals intact, no tenderness or fluctuance on palpation.    Laboratory & Radiologic Studies: None new  Assessment & Plan: Megan Haas is a 47 y.o. woman with with Stage IA1  grade 1 endometrial cancer who presents for follow-up. P53 WT, MMR intact.   Patient is doing very well from a postoperative standpoint.  Meeting all milestones.  Discussed continued expectations and restrictions.  Vaginal cuff is healing very well.  We reviewed her pathology from surgery in detail.  The patient was given a copy of her pathology report.  We discussed that given early stage, low risk disease, no adjuvant treatment is indicated.  We reviewed NCCN surveillance  recommendations with visits every 6 months.  Given her low risk disease, I would recommend alternating these visits between my office and her OB/GYN.  I will plan to see her back for her first 50-monthvisit in September.  We reviewed signs and symptoms that would be concerning for cancer recurrence, and I stressed the importance of calling if she develops any of these.  She endorses very mild hot flashes.  We discussed again the option of starting hormonal therapy.  At this time, she prefers not to start ERT.  She will let me know in the next 4-6 weeks if she becomes increasingly symptomatic.  32 minutes of total time was spent for this patient encounter, including preparation, face-to-face counseling with the patient and coordination of care, and documentation of the encounter.  KJeral Pinch MD  Division of Gynecologic Oncology  Department of Obstetrics and Gynecology  [facility]

## 2022-10-09 ENCOUNTER — Other Ambulatory Visit: Payer: Self-pay | Admitting: Gynecologic Oncology

## 2022-10-09 DIAGNOSIS — N939 Abnormal uterine and vaginal bleeding, unspecified: Secondary | ICD-10-CM

## 2022-10-19 ENCOUNTER — Telehealth: Payer: Self-pay | Admitting: Oncology

## 2022-10-19 DIAGNOSIS — C541 Malignant neoplasm of endometrium: Secondary | ICD-10-CM

## 2022-10-19 NOTE — Telephone Encounter (Signed)
Left a message regarding genetic counseling.  Requested a return call. 

## 2022-10-20 NOTE — Telephone Encounter (Signed)
Megan Haas called back and is interested in genetic counseling.  Advised her that she will receive a call from the schedulers with an appointment.

## 2022-10-21 ENCOUNTER — Telehealth: Payer: Self-pay | Admitting: Gynecologic Oncology

## 2022-10-21 NOTE — Telephone Encounter (Signed)
Per 3/27 IB reached out to patient to schedule.

## 2022-10-22 ENCOUNTER — Encounter: Payer: Self-pay | Admitting: Genetic Counselor

## 2022-10-22 ENCOUNTER — Inpatient Hospital Stay (HOSPITAL_BASED_OUTPATIENT_CLINIC_OR_DEPARTMENT_OTHER): Payer: Medicare Other | Admitting: Genetic Counselor

## 2022-10-22 ENCOUNTER — Other Ambulatory Visit: Payer: Self-pay | Admitting: Genetic Counselor

## 2022-10-22 ENCOUNTER — Other Ambulatory Visit: Payer: Self-pay

## 2022-10-22 ENCOUNTER — Inpatient Hospital Stay: Payer: Medicare Other

## 2022-10-22 DIAGNOSIS — C541 Malignant neoplasm of endometrium: Secondary | ICD-10-CM

## 2022-10-22 DIAGNOSIS — Z1379 Encounter for other screening for genetic and chromosomal anomalies: Secondary | ICD-10-CM

## 2022-10-22 LAB — GENETIC SCREENING ORDER

## 2022-10-22 NOTE — Progress Notes (Signed)
REFERRING PROVIDER: Everardo Beals, NP [address] [place],  [place] [address]  PRIMARY PROVIDER:  Everardo Beals, NP  PRIMARY REASON FOR VISIT:  1. Endometrial cancer ([place])    HISTORY OF PRESENT ILLNESS:   Megan Haas, a 47 y.o. female, was seen for a [facility] [place] cancer genetics consultation at the request of Dr. Benna Dunks due to a personal and family history of cancer.  Megan Haas presents to clinic today to discuss the possibility of a hereditary predisposition to cancer, to discuss genetic testing, and to further clarify her future cancer risks, as well as potential cancer risks for family members.   In February 2024, at the age of 46, Megan Haas was diagnosed with endometrial cancer.  CANCER HISTORY:  Oncology History Overview Note  P53 WT, MMR intact   Endometrial cancer ([place])  09/10/2022 Initial Diagnosis   Endometrial cancer ([place])     RISK FACTORS:  First live birth at age 3.  OCP use for approximately 0 years.  Ovaries intact: no.  Uterus intact: no.  HRT use: 0 years. Colonoscopy: yes;  2022- 4 hyperplastic polyps identified . Mammogram within the last year: yes. Number of breast biopsies: 0. Up to date with pelvic exams: yes. Any excessive radiation exposure in the past: no  Past Medical History:  Diagnosis Date   Adenocarcinoma ([place]) 07/2022   uterus   Anemia    Follows w/ Hematology, Richrd Sox, NP @ [facility].S/p 2 iron infusions in 2023.   Anxiety    Arthritis    back   Bipolar 1 disorder ([place])    Follows w/ Dr. Ronney Asters @ [facility] for biopolar, depression, and anxiety.   COVID-19 07/2020   Depression    Diabetes mellitus without complication ([place])    Type 2. Pt is taking Ozempic & follows w/ [facility].   Fibromyalgia    Follows w/ PCP, Burr Medico, NP @ [facility].   History of sleep study    Pt stated she had a sleep study done in 2023 and was told she does not have sleep apnea.   Obesity, Class III,  BMI 40-49.9 (morbid obesity) ([place])    Follows with [facility].   PTSD (post-traumatic stress disorder)    hx of   Wears glasses    for distance    Past Surgical History:  Procedure Laterality Date   CERVICAL CONIZATION W/BX N/A 08/27/2022   Procedure: CERVIAL BIOPSIES AND ENDOCERVICAL CURRETTAGE;  Surgeon: [name], MD;  Location: [facility];  Service: Gynecology;  Laterality: N/A;   CHOLECYSTECTOMY  2006   COLONOSCOPY W/ POLYPECTOMY  03/26/2021   multiple colon polyps   DILATATION & CURETTAGE/HYSTEROSCOPY WITH MYOSURE N/A 08/27/2022   Procedure: DILATATION & CURETTAGE/HYSTEROSCOPY WITH MYOSURE;  Surgeon: [name], MD;  Location: [facility];  Service: Gynecology;  Laterality: N/A;   ESOPHAGOGASTRODUODENOSCOPY  08/18/2018   GASTROPLASTY DUODENAL SWITCH  01/2019   robotically   ROBOTIC ASSISTED LAPAROSCOPIC HYSTERECTOMY AND SALPINGECTOMY Bilateral 09/10/2022   Procedure: XI ROBOTIC ASSISTED LAPAROSCOPIC HYSTERECTOMY AND BILATERAL SALPINGO OOPHORECTOMY;  Surgeon: [name], MD;  Location: [facility];  Service: Gynecology;  Laterality: Bilateral;   SENTINEL NODE BIOPSY N/A 09/10/2022   Procedure: SENTINEL NODE BIOPSY;  Surgeon: [name], MD;  Location: [facility];  Service: Gynecology;  Laterality: N/A;    Social History   Socioeconomic History   Marital status: Married    Spouse name: Not on file  Number of children: Not on file   Years of education: Not on file   Highest education level: Not on file  Occupational History   Not on file  Tobacco Use   Smoking status: Former    Packs/day: 0.75    Years: 10.00    Additional pack years: 0.00    Total pack years: 7.50    Types: Cigarettes    Quit date: 2004    Years since quitting: 20.2   Smokeless tobacco: Never  Vaping Use   Vaping Use: Never used  Substance and Sexual Activity   Alcohol use: Yes    Alcohol/week: 1.0 standard drink of alcohol     Types: 1 Standard drinks or equivalent per week    Comment: occasional   Drug use: Not Currently    Comment: 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, pain killers, marijuana, acid   Sexual activity: Yes    Birth control/protection: None  Other Topics Concern   Not on file  Social History Narrative   Not on file   Social Determinants of Health   Financial Resource Strain: Not on file  Food Insecurity: Not on file  Transportation Needs: Not on file  Physical Activity: Not on file  Stress: Not on file  Social Connections: Not on file     FAMILY HISTORY:  We obtained a detailed, 4-generation family history.  Significant diagnoses are listed below: Family History  Problem Relation Age of Onset   Diabetes Father    Prostate cancer Father 54   Hypertension Maternal Aunt    Diabetes Maternal Uncle    Hypertension Maternal Grandmother    Skin cancer Maternal Grandmother    Diabetes Paternal Grandmother    Uterine cancer Other        maternal first cousin once removed   Uterine cancer Other        maternal first cousin once removed   Colon cancer Neg Hx    Stomach cancer Neg Hx    Pancreatic cancer Neg Hx    Esophageal cancer Neg Hx    Breast cancer Neg Hx    Ovarian cancer Neg Hx    Endometrial cancer Neg Hx       Megan Haas has two maternal first cousins once removed who have a history of uterine cancer. Her father was diagnosed with prostate cancer at age 57, he is deceased. Megan Haas is unaware of previous family history of genetic testing for hereditary cancer risks. There is no reported Ashkenazi Jewish ancestry.   GENETIC COUNSELING ASSESSMENT: Megan Haas is a 47 y.o. female with a personal and family history of cancer which is somewhat suggestive of a hereditary predisposition to cancer given her young age at diagnosis. We, therefore, discussed and recommended the following at today's visit.   DISCUSSION: We discussed that 5 - 10% of cancer is hereditary, with most cases  of uterine cancer associated with Lynch Syndrome.  There are other genes that can be associated with hereditary uterine cancer syndromes.  We discussed that testing is beneficial for several reasons including knowing how to follow individuals after completing their treatment and understanding if other family members could be at risk for cancer and allowing them to undergo genetic testing.   We reviewed the characteristics, features and inheritance patterns of hereditary cancer syndromes. We also discussed genetic testing, including the appropriate family members to test, the process of testing, insurance coverage and turn-around-time for results. We discussed the implications of a negative, positive, carrier and/or  variant of uncertain significant result. We recommended Megan Haas pursue genetic testing for a panel that includes genes associated with uterine and prostate cancer.   Megan Haas  was offered a common hereditary cancer panel (34 genes) and an expanded pan-cancer panel (71 genes). Megan Haas was informed of the benefits and limitations of each panel, including that expanded pan-cancer panels contain genes that do not have clear management guidelines at this point in time.  We also discussed that as the number of genes included on a panel increases, the chances of variants of uncertain significance increases. After considering the benefits and limitations of each gene panel, Megan Haas elected to have [place] Panel.  The CancerNext-Expanded gene panel offered by [facility] and includes sequencing, rearrangement, and RNA analysis for the following 71 genes: AIP, ALK, APC, ATM, AXIN2, BAP1, BARD1, BMPR1A, BRCA1, BRCA2, BRIP1, CDC73, CDH1, CDK4, CDKN1B, CDKN2A, CHEK2, CTNNA1, DICER1, FH, FLCN, KIF1B, LZTR1, MAX, MEN1, MET, MLH1, MSH2, MSH3, MSH6, MUTYH, NF1, NF2, NTHL1, PALB2, PHOX2B, PMS2, POT1, PRKAR1A, PTCH1, PTEN, RAD51C, RAD51D, RB1, RET, SDHA, SDHAF2, SDHB, SDHC,  SDHD, SMAD4, SMARCA4, SMARCB1, SMARCE1, STK11, SUFU, TMEM127, TP53, TSC1, TSC2, and VHL (sequencing and deletion/duplication); EGFR, EGLN1, HOXB13, KIT, MITF, PDGFRA, POLD1, and POLE (sequencing only); EPCAM and GREM1 (deletion/duplication only).   Based on Megan Haas's personal and family history of cancer, she meets medical criteria for genetic testing. Despite that she meets criteria, she may still have an out of pocket cost. We discussed that if her out of pocket cost for testing is over $100, the laboratory will call and confirm whether she wants to proceed with testing.  If the out of pocket cost of testing is less than $100 she will be billed by the genetic testing laboratory.   PLAN: After considering the risks, benefits, and limitations, Ms. Carris provided informed consent to pursue genetic testing and the blood sample was sent to [facility] for analysis of the CancerNext-Expanded Panel. Results should be available within approximately 2-3 weeks' time, at which point they will be disclosed by telephone to Megan Haas, as will any additional recommendations warranted by these results. Megan Haas will receive a summary of her genetic counseling visit and a copy of her results once available. This information will also be available in Epic.   Megan Haas questions were answered to her satisfaction today. Our contact information was provided should additional questions or concerns arise. Thank you for the referral and allowing [place] to share in the care of your patient.   Megan Passy, MS, [facility] Genetic Counselor [email or web address] (P) [phone]  The patient was seen for a total of 30 minutes in face-to-face genetic counseling.  The patient brought her husband. Drs. Lindi Adie and/or Burr Medico were available to discuss this case as needed.   _______________________________________________________________________ For Office Staff:  Number of people involved in session:  2 Was an Intern/ student involved with case: no

## 2022-11-06 ENCOUNTER — Encounter: Payer: Self-pay | Admitting: Genetic Counselor

## 2022-11-06 ENCOUNTER — Telehealth: Payer: Self-pay | Admitting: Genetic Counselor

## 2022-11-06 DIAGNOSIS — Z1379 Encounter for other screening for genetic and chromosomal anomalies: Secondary | ICD-10-CM | POA: Insufficient documentation

## 2022-11-06 NOTE — Telephone Encounter (Signed)
I attempted to contact Megan Haas to discuss her genetic testing results (71 genes). I left a voicemail requesting she call me back at [phone].  Lalla Brothers, MS, [facility] Genetic Counselor [email or web address] (P) [phone]

## 2022-11-11 ENCOUNTER — Telehealth: Payer: Self-pay | Admitting: Genetic Counselor

## 2022-11-11 NOTE — Telephone Encounter (Signed)
I contacted Megan Haas to discuss her genetic testing results. No pathogenic variants were identified in the 71 genes analyzed. Detailed clinic note to follow.  The test report has been scanned into EPIC and is located under the Molecular Pathology section of the Results Review tab.  A portion of the result report is included below for reference.   Lalla Brothers, MS, [facility] Genetic Counselor [email or web address] (P) [phone]

## 2022-11-16 ENCOUNTER — Ambulatory Visit: Payer: Self-pay | Admitting: Genetic Counselor

## 2022-11-16 ENCOUNTER — Encounter: Payer: Self-pay | Admitting: Genetic Counselor

## 2022-11-16 DIAGNOSIS — Z1379 Encounter for other screening for genetic and chromosomal anomalies: Secondary | ICD-10-CM

## 2022-11-16 NOTE — Progress Notes (Signed)
HPI:   Megan Haas was previously seen in the [facility] due to a personal and family history of cancer and concerns regarding a hereditary predisposition to cancer. Please refer to our prior cancer genetics clinic note for more information regarding our discussion, assessment and recommendations, at the time. Megan Haas recent genetic test results were disclosed to her, as were recommendations warranted by these results. These results and recommendations are discussed in more detail below.  CANCER HISTORY:  Oncology History Overview Note  P53 WT, MMR intact   Endometrial cancer  09/10/2022 Initial Diagnosis   Endometrial cancer (HCC)     FAMILY HISTORY:  We obtained a detailed, 4-generation family history.  Significant diagnoses are listed below: Family History  Problem Relation Age of Onset   Diabetes Father    Prostate cancer Father 51   Hypertension Maternal Aunt    Diabetes Maternal Uncle    Hypertension Maternal Grandmother    Skin cancer Maternal Grandmother    Diabetes Paternal Grandmother    Uterine cancer Other        maternal first cousin once removed   Uterine cancer Other        maternal first cousin once removed   Colon cancer Neg Hx    Stomach cancer Neg Hx    Pancreatic cancer Neg Hx    Esophageal cancer Neg Hx    Breast cancer Neg Hx    Ovarian cancer Neg Hx    Endometrial cancer Neg Hx          Megan Haas has two maternal first cousins once removed who have a history of uterine cancer. Her father was diagnosed with prostate cancer at age 13, he is deceased. Megan Haas is unaware of previous family history of genetic testing for hereditary cancer risks. There is no reported Ashkenazi Jewish ancestry.   GENETIC TEST RESULTS:  The [facility] CancerNext-Expanded Panel found no pathogenic mutations.   The CancerNext-Expanded gene panel offered by [facility] and includes sequencing, rearrangement, and RNA analysis for the  following 71 genes: AIP, ALK, APC, ATM, AXIN2, BAP1, BARD1, BMPR1A, BRCA1, BRCA2, BRIP1, CDC73, CDH1, CDK4, CDKN1B, CDKN2A, CHEK2, CTNNA1, DICER1, FH, FLCN, KIF1B, LZTR1, MAX, MEN1, MET, MLH1, MSH2, MSH3, MSH6, MUTYH, NF1, NF2, NTHL1, PALB2, PHOX2B, PMS2, POT1, PRKAR1A, PTCH1, PTEN, RAD51C, RAD51D, RB1, RET, SDHA, SDHAF2, SDHB, SDHC, SDHD, SMAD4, SMARCA4, SMARCB1, SMARCE1, STK11, SUFU, TMEM127, TP53, TSC1, TSC2, and VHL (sequencing and deletion/duplication); EGFR, EGLN1, HOXB13, KIT, MITF, PDGFRA, POLD1, and POLE (sequencing only); EPCAM and GREM1 (deletion/duplication only).    The test report has been scanned into EPIC and is located under the Molecular Pathology section of the Results Review tab.  A portion of the result report is included below for reference. Genetic testing reported out on 11/06/2022.       Even though a pathogenic variant was not identified, possible explanations for the cancer in the family may include: There may be no hereditary risk for cancer in the family. The cancers in Megan Haas and/or her family may be due to other genetic or environmental factors. There may be a gene mutation in one of these genes that current testing methods cannot detect, but that chance is small. There could be another gene that has not yet been discovered, or that we have not yet tested, that is responsible for the cancer diagnoses in the family.  Therefore, it is important to remain in touch with cancer genetics in the future so that we can continue  to offer Megan Haas the most up to date genetic testing.  ADDITIONAL GENETIC TESTING:  We discussed with Megan Haas that her genetic testing was fairly extensive.  If there are genes identified to increase cancer risk that can be analyzed in the future, we would be happy to discuss and coordinate this testing at that time.    CANCER SCREENING RECOMMENDATIONS:  Megan Haas test result is considered negative (normal).  This means that we have  not identified a hereditary cause for her personal and family history of cancer at this time.   An individual's cancer risk and medical management are not determined by genetic test results alone. Overall cancer risk assessment incorporates additional factors, including personal medical history, family history, and any available genetic information that may result in a personalized plan for cancer prevention and surveillance. Therefore, it is recommended she continue to follow the cancer management and screening guidelines provided by her oncology and primary healthcare provider.  FOLLOW-UP:  Cancer genetics is a rapidly advancing field and it is possible that new genetic tests will be appropriate for her and/or her family members in the future. We encouraged her to remain in contact with cancer genetics on an annual basis so we can update her personal and family histories and let her know of advances in cancer genetics that may benefit this family.   Our contact number was provided. Megan Haas questions were answered to her satisfaction, and she knows she is welcome to call us at anytime with additional questions or concerns.   Lalla Brothers, MS, [facility] Genetic Counselor [email or web address] (P) [phone]

## 2022-11-30 ENCOUNTER — Telehealth: Payer: Self-pay

## 2022-11-30 NOTE — Telephone Encounter (Signed)
Pt returned call, she is aware of advice from Dr. Pricilla Holm she will use ice/heat and NSAID's.

## 2022-11-30 NOTE — Telephone Encounter (Signed)
See advise message from Dr. Pricilla Holm  VM left for patient to call regarding Dr.Tucker's message about the numbness in her left thigh

## 2022-11-30 NOTE — Telephone Encounter (Signed)
Megan Haas called office stating a concern of upper left thigh numbness  S/P: XI ROBOTIC ASSISTED LAPAROSCOPIC HYSTERECTOMY AND BILATERAL SALPINGO OOPHORECTOMY (Bilateral)  SENTINEL NODE BIOPSY on 2/15 with Dr.Tucker  Pt states about 5 days ago she started having numbness on the left upper outer thigh closer to hip area and radiates to her buttock. Hurts worse when walks and moves, but not when she sits. No redness/tingling, no warm to touch. No fever/chills. No swelling.The lower part of leg is fine. She has not fallen and can't remember doing anything prior to this starting. Tylenol/Ibuprofen helps some but only for a short period.  Dr. Pricilla Holm notified

## 2023-04-07 ENCOUNTER — Encounter: Payer: Self-pay | Admitting: Gynecologic Oncology

## 2023-04-08 ENCOUNTER — Encounter: Payer: Self-pay | Admitting: Gynecologic Oncology

## 2023-04-08 ENCOUNTER — Inpatient Hospital Stay: Payer: Medicare Other | Attending: Gynecologic Oncology | Admitting: Gynecologic Oncology

## 2023-04-08 VITALS — BP 113/61 | HR 75 | Temp 98.3°F | Resp 16 | Wt 288.0 lb

## 2023-04-08 DIAGNOSIS — Z8542 Personal history of malignant neoplasm of other parts of uterus: Secondary | ICD-10-CM | POA: Insufficient documentation

## 2023-04-08 DIAGNOSIS — N951 Menopausal and female climacteric states: Secondary | ICD-10-CM | POA: Diagnosis not present

## 2023-04-08 DIAGNOSIS — K5900 Constipation, unspecified: Secondary | ICD-10-CM | POA: Insufficient documentation

## 2023-04-08 DIAGNOSIS — Z90710 Acquired absence of both cervix and uterus: Secondary | ICD-10-CM | POA: Insufficient documentation

## 2023-04-08 DIAGNOSIS — Z90722 Acquired absence of ovaries, bilateral: Secondary | ICD-10-CM | POA: Insufficient documentation

## 2023-04-08 DIAGNOSIS — C541 Malignant neoplasm of endometrium: Secondary | ICD-10-CM

## 2023-04-08 NOTE — Patient Instructions (Signed)
It was good to see you today.  I do not see or feel any evidence of cancer recurrence on your exam.  Please reach out to your OB/GYN about scheduling a follow-up in 6 months.  Sometime over the summer next year, please call my office to schedule a visit in September 2025.  We will continue with visits every 6 months until 5 years.  As always, if you develop any new and concerning symptoms before your next visit, please call to see me sooner.

## 2023-04-08 NOTE — Progress Notes (Signed)
Gynecologic Oncology Return Clinic Visit  04/08/23  Reason for Visit: surveillance  Treatment History: Oncology History Overview Note  P53 WT, MMR intact   Endometrial cancer (HCC)  09/10/2022 Initial Diagnosis   Endometrial cancer (HCC)    The patient endorses a longstanding history of irregular menses.  She saw her OBGYN in 07/2022. At the time of that visit, 2 or 3 larger cervical polyps were seen on exam, partially evulsed. Cervical polyp biopsy: 07/30/2022 reveals well-differentiated invasive adenocarcinoma.  IHC positive for p16, ER and PR.  Negative for p53.  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   08/27/22: Hysteroscopy with endometrial curettings, cervical biopsies, endocervical curettage.  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  This is seen in both the prolapsing endometrial polyp as well as the endometrial curettings.  Cervical biopsies show benign endocervical mucosa, no dysplasia or malignancy.  Endocervical curettage also negative for dysplasia or malignancy.   08/27/22: PET 1. Increased radiotracer uptake localizing to the endometrium compatible with known endometrial adenocarcinoma. 2. No signs of tracer avid retroperitoneal or pelvic lymph nodes. 3. 1.6 cm FDG avid central mesenteric lymph node is identified has an SUV max of 6.38. This is of uncertain clinical significance, and in the absence of retroperitoneal or pelvic adenopathy this would be an unusual location for nodal metastasis. Close interval follow-up is advised. 4. There is a borderline enlarged right inguinal lymph node with mild tracer uptake. This is nonspecific and may be reactive. 5. Two small nonspecific pulmonary nodules are identified measuring up to 5 mm. These are technically too small to characterize by PET-CT. Attention in these nodules on future  surveillance imaging is recommended. 6. Hepatic steatosis.   09/10/22: TRH/BSO, bilateral SLN biopsy, LOA. Stage IA1 low-grade endometrioid. No MI. NO LVI. P53 WT.   Interval History: Doing well.  Denies any abdominal or pelvic pain.  Denies any vaginal bleeding or discharge.  Reports baseline bowel bladder function.  She continues to have some mild hot flashes, not very bothersome.  Denies other menopausal symptoms.  Past Medical/Surgical History: Past Medical History:  Diagnosis Date   Adenocarcinoma (HCC) 07/2022   uterus   Anemia    Follows w/ Hematology, Tressie Ellis, NP @ [facility].S/p 2 iron infusions in 2023.   Anxiety    Arthritis    back   Bipolar 1 disorder (HCC)    Follows w/ Dr. Kristeen Mans @ [facility] for biopolar, depression, and anxiety.   COVID-19 07/2020   Depression    Diabetes mellitus without complication (HCC)    Type 2. Pt is taking Ozempic & follows w/ [facility].   Fibromyalgia    Follows w/ PCP, Wray Kearns, NP @ [facility].   History of sleep study    Pt stated she had a sleep study done in 2023 and was told she does not have sleep apnea.   Obesity, Class III, BMI 40-49.9 (morbid obesity) (HCC)    Follows with [facility].   PTSD (post-traumatic stress disorder)    hx of   Wears glasses    for distance    Past Surgical History:  Procedure Laterality Date   CERVICAL CONIZATION W/BX N/A 08/27/2022   Procedure: CERVIAL BIOPSIES AND ENDOCERVICAL CURRETTAGE;  Surgeon: Carver Fila, MD;  Location: [facility];  Service: Gynecology;  Laterality: N/A;   CHOLECYSTECTOMY  2006  COLONOSCOPY W/ POLYPECTOMY  03/26/2021   multiple colon polyps   DILATATION & CURETTAGE/HYSTEROSCOPY WITH MYOSURE N/A 08/27/2022   Procedure: DILATATION & CURETTAGE/HYSTEROSCOPY WITH MYOSURE;  Surgeon: Carver Fila, MD;  Location: [facility];  Service: Gynecology;  Laterality: N/A;    ESOPHAGOGASTRODUODENOSCOPY  08/18/2018   GASTROPLASTY DUODENAL SWITCH  01/2019   robotically   ROBOTIC ASSISTED LAPAROSCOPIC HYSTERECTOMY AND SALPINGECTOMY Bilateral 09/10/2022   Procedure: XI ROBOTIC ASSISTED LAPAROSCOPIC HYSTERECTOMY AND BILATERAL SALPINGO OOPHORECTOMY;  Surgeon: Carver Fila, MD;  Location: [facility];  Service: Gynecology;  Laterality: Bilateral;   SENTINEL NODE BIOPSY N/A 09/10/2022   Procedure: SENTINEL NODE BIOPSY;  Surgeon: Carver Fila, MD;  Location: [facility];  Service: Gynecology;  Laterality: N/A;    Family History  Problem Relation Age of Onset   Diabetes Father    Prostate cancer Father 73   Hypertension Maternal Aunt    Diabetes Maternal Uncle    Hypertension Maternal Grandmother    Skin cancer Maternal Grandmother    Diabetes Paternal Grandmother    Uterine cancer Other        maternal first cousin once removed   Uterine cancer Other        maternal first cousin once removed   Colon cancer Neg Hx    Stomach cancer Neg Hx    Pancreatic cancer Neg Hx    Esophageal cancer Neg Hx    Breast cancer Neg Hx    Ovarian cancer Neg Hx    Endometrial cancer Neg Hx     Social History   Socioeconomic History   Marital status: Married    Spouse name: Not on file   Number of children: Not on file   Years of education: Not on file   Highest education level: Not on file  Occupational History   Not on file  Tobacco Use   Smoking status: Former    Current packs/day: 0.00    Average packs/day: 0.8 packs/day for 10.0 years (7.5 ttl pk-yrs)    Types: Cigarettes    Start date: 2    Quit date: 2004    Years since quitting: 20.7   Smokeless tobacco: Never  Vaping Use   Vaping status: Never Used  Substance and Sexual Activity   Alcohol use: Yes    Alcohol/week: 1.0 standard drink of alcohol    Types: 1 Standard drinks or equivalent per week    Comment: occasional   Drug use: Not Currently    Comment: 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, pain killers,  marijuana, acid   Sexual activity: Yes    Birth control/protection: None  Other Topics Concern   Not on file  Social History Narrative   Not on file   Social Determinants of Health   Financial Resource Strain: Medium Risk (06/21/2021)   Received from [facility]   Overall Financial Resource Strain (CARDIA)    Difficulty of Paying Living Expenses: Somewhat hard  Food Insecurity: No Food Insecurity (06/21/2021)   Received from [facility], [facility]   Hunger Vital Sign    Worried About Running Out of Food in the Last Year: Never true    Ran Out of Food in the Last Year: Never true  Transportation Needs: No Transportation Needs (06/21/2021)   Received from [facility], [facility]   PRAPARE - Transportation    Lack of Transportation (Medical): No    Lack of Transportation (Non-Medical): No  Physical Activity: Sufficiently Active (06/21/2021)   Received  from [facility], [facility]   Exercise Vital Sign    Days of Exercise per Week: 3 days    Minutes of Exercise per Session: 60 min  Stress: No Stress Concern Present (06/21/2021)   Received from [facility], [facility] of Occupational Health - Occupational Stress Questionnaire    Feeling of Stress : Not at all  Social Connections: Unknown (11/28/2021)   Received from [facility], [facility]   Social Network    Social Network: Not on file    Current Medications:  Current Outpatient Medications:    acetaminophen (TYLENOL) 500 MG tablet, Take 1 tablet by mouth daily as needed., Disp: , Rfl:    amphetamine-dextroamphetamine (ADDERALL) 20 MG tablet, Take 20 mg by mouth 2 (two) times daily., Disp: , Rfl:    brexpiprazole (REXULTI) 2 MG TABS tablet, Take 2 mg by mouth in the morning., Disp: , Rfl:    buPROPion (WELLBUTRIN XL) 150 MG 24 hr tablet, Take 450 mg by mouth at bedtime., Disp: , Rfl:    Calcium Carb-Cholecalciferol (CALCIUM 500/D) 500-10 MG-MCG CHEW, Chew 1 tablet  by mouth in the morning, at noon, in the evening, and at bedtime., Disp: , Rfl:    gabapentin (NEURONTIN) 400 MG capsule, Take 400-800 mg by mouth See admin instructions. Take 1 capsule (400 mg) by mouth in the morning & take 2 capsules (800 mg) by mouth at bedtime., Disp: , Rfl:    ipratropium (ATROVENT) 0.03 % nasal spray, Place 2 sprays into both nostrils 2 (two) times daily as needed (allergies.)., Disp: , Rfl:    Multiple Vitamins-Minerals (MULTIVITAMIN WITH MINERALS) tablet, Take 1 tablet by mouth in the morning., Disp: , Rfl:    naltrexone (DEPADE) 50 MG tablet, Take by mouth daily., Disp: , Rfl:    tirzepatide (MOUNJARO) 10 MG/0.5ML Pen, Inject 10 mg into the skin once a week., Disp: , Rfl:    VIIBRYD 40 MG TABS, Take 40 mg by mouth in the morning., Disp: , Rfl: 1  Review of Systems: Denies appetite changes, fevers, chills, fatigue, unexplained weight changes. Denies hearing loss, neck lumps or masses, mouth sores, ringing in ears or voice changes. Denies cough or wheezing.  Denies shortness of breath. Denies chest pain or palpitations. Denies leg swelling. Denies abdominal distention, pain, blood in stools, constipation, diarrhea, nausea, vomiting, or early satiety. Denies pain with intercourse, dysuria, frequency, hematuria or incontinence. Denies hot flashes, pelvic pain, vaginal bleeding or vaginal discharge.   Denies joint pain, back pain or muscle pain/cramps. Denies itching, rash, or wounds. Denies dizziness, headaches, numbness or seizures. Denies swollen lymph nodes or glands, denies easy bruising or bleeding. Denies anxiety, depression, confusion, or decreased concentration.  Physical Exam: BP 113/61 (BP Location: Left Arm, Patient Position: Sitting)   Pulse 75   Temp 98.3 F (36.8 C) (Oral)   Resp 16   Wt 288 lb (130.6 kg)   SpO2 95%   BMI 47.93 kg/m  General: Alert, oriented, no acute distress. HEENT: Normocephalic, atraumatic, sclera anicteric. Chest: Clear to  auscultation bilaterally.  No wheezes or rhonchi. Cardiovascular: Regular rate and rhythm, no murmurs. Abdomen: Obese, soft, nontender.  Normoactive bowel sounds.  No masses or hepatosplenomegaly appreciated.  Well-healed incisions. Extremities: Grossly normal range of motion.  Warm, well perfused.  No edema bilaterally. Skin: No rashes or lesions noted. Lymphatics: No cervical, supraclavicular, or inguinal adenopathy. GU: Normal appearing external genitalia without erythema, excoriation, or lesions.  Speculum exam reveals cuff intact, no  visible lesions, no blood or discharge within the vaginal vault.  Bimanual exam reveals cuff intact without nodularity or masses palpated.  Rectovaginal exam confirms these findings.  Laboratory & Radiologic Studies: None new  Assessment & Plan: MURANDA KLAES is a 47 y.o. woman with Stage IA1 grade 1 endometrial cancer who presents for follow-up. Surgery in 08/2022. P53 WT, MMR intact. Negative germline genetic testing.   Patient is doing well and is NED on exam today.    Per NCCN surveillance recommendations, we will continue with visits every 6 months.  Given her low risk disease, we are alternating these visits between my office and her OB/GYN.  She will see her OB/GYN in 6 months and come back to see me in 1 year.  We reviewed signs and symptoms that would be concerning for cancer recurrence, and I stressed the importance of calling if she develops any of these.  Encouraged her to try daily fiber supplement or probiotic for her intermittent baseline constipation.  She typically takes MiraLAX as needed.   She endorses very mild hot flashes, not very bothersome.  Discussed deferring starting any hormone replacement therapy.  20 minutes of total time was spent for this patient encounter, including preparation, face-to-face counseling with the patient and coordination of care, and documentation of the encounter.  Eugene Garnet, MD  Division of  Gynecologic Oncology  Department of Obstetrics and Gynecology  [facility]

## 2023-11-22 ENCOUNTER — Other Ambulatory Visit: Payer: Self-pay | Admitting: Obstetrics and Gynecology

## 2023-11-22 DIAGNOSIS — R928 Other abnormal and inconclusive findings on diagnostic imaging of breast: Secondary | ICD-10-CM

## 2023-12-06 ENCOUNTER — Ambulatory Visit
Admission: RE | Admit: 2023-12-06 | Discharge: 2023-12-06 | Disposition: A | Discharge status: Home / Self Care | Source: Ambulatory Visit | Attending: Obstetrics and Gynecology | Admitting: Obstetrics and Gynecology

## 2023-12-06 DIAGNOSIS — R928 Other abnormal and inconclusive findings on diagnostic imaging of breast: Secondary | ICD-10-CM

## 2024-01-12 ENCOUNTER — Other Ambulatory Visit: Payer: Self-pay

## 2024-01-12 ENCOUNTER — Ambulatory Visit: Attending: Obstetrics and Gynecology

## 2024-01-12 DIAGNOSIS — M5459 Other low back pain: Secondary | ICD-10-CM | POA: Diagnosis present

## 2024-01-12 DIAGNOSIS — R293 Abnormal posture: Secondary | ICD-10-CM | POA: Diagnosis present

## 2024-01-12 DIAGNOSIS — M62838 Other muscle spasm: Secondary | ICD-10-CM | POA: Diagnosis present

## 2024-01-12 DIAGNOSIS — M6281 Muscle weakness (generalized): Secondary | ICD-10-CM | POA: Diagnosis present

## 2024-01-12 NOTE — Patient Instructions (Addendum)
   The first picture shows that there is no effect on the pelvic floor with gravity eliminated. The next three show that with a wedge pillow or a few pillows from home under your pelvis the pelvic floor is inverted and may relax and allows gravity to help return prolapsed areas more inward to help relieve symptoms. Do this 15-20 mins every evening when symptoms tend to be worse. Stop if you have pain or negative symptoms.     Vulvar/vaginal Massage: This is a technique to help decrease painful sensitivity in the vaginal area. It can also help to restore normal moisture levels in the vaginal tissues. With coconut oil, aloe, jojoba oil, or a specific vaginal moisturizer, gently massage into vaginal tissues. Think of this as part of your post-shower routine and moisturizing just like you would the rest of the body with lotion. This helps to increase good blood flow to the vaginal tissues. In addition, it also teaches the body that touch to the vagina does not have to be painful or threatening, but moisturizing and gentle.    Bowel massage: To assist with more regular and more comfortable bowel movements, try performing bowel massage nightly for 5-10 minutes. Place hands in the lower right side of your abdomen to start; in small circles, massage up, across, and down the left side of your abdomen. Pressure does not need to be hard, but just comfortable. You can use lotion or oil to make more comfortable.    Squatty potty: When your knees are level or below the level of your hips, pelvic floor muscles are pressed against rectum, preventing ease of bowel movement. By getting knees above the level of the hips, these pelvic floor muscles relax, allowing easier passage of bowel movement. Ways to get knees above hips: o Squatty Potty (7inch and 9inch versions) o Small stool o Roll of toilet paper under each foot o Hardback book or stack of magazines under each foot  Relaxed Toileting  mechanics: Once in this position, make sure to lean back, wide knees, relaxed stomach, and breathe.   [facility] [address], [address] [place], [place] [address] Phone # [phone] Fax [phone]

## 2024-01-12 NOTE — Therapy (Signed)
 OUTPATIENT PHYSICAL THERAPY FEMALE PELVIC EVALUATION   Patient Name: Megan Haas MRN: [id number] DOB:19-Nov-1975, 48 y.o., female Today's Date: 01/12/2024  END OF SESSION:  PT End of Session - 01/12/24 1016     Visit Number 1    Date for PT Re-Evaluation 04/05/24    Authorization Type Medicare Kx at 15    Progress Note Due on Visit 10    PT Start Time 1015    PT Stop Time 1055    PT Time Calculation (min) 40 min    Activity Tolerance Patient tolerated treatment well    Behavior During Therapy [facility] for tasks assessed/performed          Past Medical History:  Diagnosis Date   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   Anxiety    Arthritis    back   Bipolar 1 disorder (HCC)    Follows w/ Dr. Sherl Diones @ [facility] for biopolar, depression, and anxiety.   COVID-19 07/2020   Depression    Diabetes mellitus without complication (HCC)    Type 2. Pt is taking Ozempic & follows w/ [facility].   Fibromyalgia    Follows w/ PCP, Kennon Peaks, NP @ [facility].   History of sleep study    Pt stated she had a sleep study done in 2023 and was told she does not have sleep apnea.   Obesity, Class III, BMI 40-49.9 (morbid obesity)    Follows with [facility].   PTSD (post-traumatic stress disorder)    hx of   Wears glasses    for distance   Past Surgical History:  Procedure Laterality Date   CERVICAL CONIZATION W/BX N/A 08/27/2022   Procedure: CERVIAL BIOPSIES AND ENDOCERVICAL CURRETTAGE;  Surgeon: Suzi Essex, MD;  Location: [facility];  Service: Gynecology;  Laterality: N/A;   CHOLECYSTECTOMY  2006   COLONOSCOPY W/ POLYPECTOMY  03/26/2021   multiple colon polyps   DILATATION & CURETTAGE/HYSTEROSCOPY WITH MYOSURE N/A 08/27/2022   Procedure: DILATATION & CURETTAGE/HYSTEROSCOPY WITH MYOSURE;  Surgeon: Suzi Essex, MD;  Location:  [facility];  Service: Gynecology;  Laterality: N/A;   ESOPHAGOGASTRODUODENOSCOPY  08/18/2018   GASTROPLASTY DUODENAL SWITCH  01/2019   robotically   ROBOTIC ASSISTED LAPAROSCOPIC HYSTERECTOMY AND SALPINGECTOMY Bilateral 09/10/2022   Procedure: XI ROBOTIC ASSISTED LAPAROSCOPIC HYSTERECTOMY AND BILATERAL SALPINGO OOPHORECTOMY;  Surgeon: Suzi Essex, MD;  Location: [facility];  Service: Gynecology;  Laterality: Bilateral;   SENTINEL NODE BIOPSY N/A 09/10/2022   Procedure: SENTINEL NODE BIOPSY;  Surgeon: Suzi Essex, MD;  Location: [facility];  Service: Gynecology;  Laterality: N/A;   Patient Active Problem List   Diagnosis Date Noted   Genetic testing 11/06/2022   Endometrial cancer (HCC) 09/10/2022   Anxiety 09/04/2022   Adenocarcinoma (HCC) 08/14/2022   Abnormal uterine bleeding 08/14/2022   Obesity, Class III, BMI 40-49.9 (morbid obesity) 08/14/2022   Polyp of colon 05/21/2022   Iron deficiency 12/17/2021   S/P biliopancreatic diversion with duodenal switch 03/06/2019   Bipolar disorder (HCC) 07/11/2018   Morbid obesity (HCC) 07/11/2018   Type 2 diabetes mellitus (HCC) 06/30/2018   Hypersomnia with long sleep time, idiopathic 04/21/2018   Fatigue due to depression 04/21/2018   Snoring 04/21/2018   Post traumatic stress disorder (PTSD) 04/21/2018    PCP: Verlene Glimpse, NP  REFERRING PROVIDER: Steve El,  Richard, MD   REFERRING DIAG: N81.6 (ICD-10-CM) - Rectocele  THERAPY DIAG:  Muscle weakness (generalized)  Other muscle spasm  Abnormal posture  Other low back pain  Rationale for Evaluation and Treatment: Rehabilitation  ONSET DATE: 8-10 months ago  SUBJECTIVE:                                                                                                                                                                                           SUBJECTIVE STATEMENT: Pt states that she has been having issues with constipation for a long time.  She states that she has been trying to splint and this is helpful. She told her gynecologist about this and he suggested PFPT. Constipation started getting worse about 8-10 months ago.   PAIN:  Are you having pain? Yes NPRS scale: 3-4/10 Pain location: low back pain (Rt>Lt)  Pain type: tight Pain description: intermittent   Aggravating factors: waking up/getting out of bed Relieving factors: sitting  PRECAUTIONS: Other: history of cancer (colon and endometrial)  RED FLAGS: None   WEIGHT BEARING RESTRICTIONS: No  FALLS:  Has patient fallen in last 6 months? No  OCCUPATION: not working   ACTIVITY LEVEL : no  PLOF: Independent  PATIENT GOALS: strengthen pelvic floor muscles  PERTINENT HISTORY:  Cholecystectomy, gastroplasty with duidenal switch 2020, laparoscopic hysterectomy/salpingectomy 2024, fibromyalgia, depression, diabetes mellitus, anixiety, bipolar 1, obesity, PTSD, adenocarcinoma, endometrial cancer,    BOWEL MOVEMENT: Pain with bowel movement: Yes - can be, but Linzess has really helped  Type of bowel movement:Type (Bristol Stool Scale) 2 or 4, Frequency 1x/day, Strain not usually, and Splinting yes, does use squatty potty Fully empty rectum: Yes: - Leakage: Yes: 2 accidents since starting linzess Pads: No Fiber supplement/laxative Yes linzess  URINATION: Pain with urination: No Fully empty bladder: Yes:   Stream: Strong Urgency: No Frequency: 3-4x/day; rarely wakes at night Fluid Intake:  between 4-64 oz a day Leakage: none Pads: No  INTERCOURSE:  Only had intercourse 1x since hysterectomy and it was painful  PREGNANCY: No children  PROLAPSE: None   OBJECTIVE:  Note: Objective measures were completed at Evaluation unless otherwise noted.   PATIENT SURVEYS:   PFIQ-7: 29  COGNITION: Overall cognitive status: Within functional limits for tasks assessed     SENSATION: Light touch: Appears intact   FUNCTIONAL TESTS:  Squat: Lyt  weight shift, bil valgus knee collapse Single leg stance:  Rt: pelvic drop  Lt: pelvic drop Curl-up test: abdominal distortion   GAIT: Assistive device utilized: None Comments: WNL  POSTURE: rounded shoulders, forward head, increased thoracic kyphosis, decreased thoracic kyphosis, posterior pelvic tilt, and elevated  Rt shoulder, possible Rt thoracic curvature   LUMBARAROM/PROM:  A/PROM A/PROM  Eval (% available)  Flexion 50  Extension 50  Right lateral flexion 75  Left lateral flexion 75  Right rotation 50  Left rotation 50   (Blank rows = not tested)  PALPATION:   General: tightness throughout lumbar paraspinals   Pelvic Alignment: WNL, posterior pelvic tilt  Abdominal: decreased rib mobility, apical breathing pattern                 External Perineal Exam: some redness and irritation, several nodules on Lt labia minora                             Internal Pelvic Floor: WNL  Patient confirms identification and approves PT to assess internal pelvic floor and treatment Yes  PELVIC MMT:   MMT eval  Vaginal 2/5, 4 second hold, 10 repeat contractions  Internal Anal Sphincter 1/5  External Anal Sphincter 1/5  Puborectalis 1/5  Diastasis Recti 2 finger width separation   (Blank rows = not tested)        TONE: low  PROLAPSE: Unable to detect in supine, morning  TODAY'S TREATMENT:                                                                                                                              DATE:  12/01/23  EVAL  Neuromuscular re-education: Pt provides verbal consent for internal vaginal/rectal pelvic floor exam. Internal vaginal and rectal pelvic floor muscle contraction training Quick flicks Long holds  Therapeutic activities: Inverted lying position  Squatty potty, posterior lean  Vulvovaginal massage Splinting education - other methods      PATIENT EDUCATION:  Education details: See above Person educated: Patient Education method:  Programmer, multimedia, Demonstration, Tactile cues, Verbal cues, and Handouts Education comprehension: verbalized understanding  HOME EXERCISE PROGRAM: [id number]  ASSESSMENT:  CLINICAL IMPRESSION: Patient is a 48 y.o. female who was seen today for physical therapy evaluation and treatment for rectocele and pelvic floor muscle weakness. Exam findings are notable for decreased lumbar A/ROM, abnormal posture, bil functional hip weakness demonstrated in squat, decreased pelvic stability in single leg stance, core weakness, decreased rib cage mobility and some abdominal restriction, vulvar irritation, vaginal pelvic floor muscle weakness 2/5, external anal sphincter/puborectalis weakness 1/5, and decreased pelvic floor muscle endurance. Signs and symptoms are most consistent with her report of rectocele and pelvic floor muscle/external anal sphincter weakness (even though no posterior vaginal wall laxity assessed today). Initial treatment consisted of pelvic floor muscle contraction training, bowel massage, and vulvovaginal moisture. She will continue to benefit from skilled PT intervention in order to improve ease of bowel movements, address impairments, correct pressure management, and progress functional strengthening program.  OBJECTIVE IMPAIRMENTS: decreased activity tolerance, decreased coordination, decreased endurance, decreased mobility, decreased ROM, decreased strength, increased fascial restrictions, increased muscle spasms, impaired flexibility, impaired tone, improper body mechanics, postural  dysfunction, and pain.   ACTIVITY LIMITATIONS: bowel movements  PARTICIPATION LIMITATIONS: toileting  PERSONAL FACTORS: 3+ comorbidities: medical history are also affecting patient's functional outcome.   REHAB POTENTIAL: Good  CLINICAL DECISION MAKING: Evolving/moderate complexity  EVALUATION COMPLEXITY: Moderate   GOALS: Goals reviewed with patient? Yes  SHORT TERM GOALS: Target date:  02/09/2024    Pt will be independent with HEP.   Baseline: Goal status: INITIAL  2.  Pt increase external anal sphincter strength to 2/5 in order to decrease fecal incontinence to decrease risk of infection.  Baseline:  Goal status: INITIAL  3.  Pt will increase lumbar A/ROM by 25% in order to allow for improved pelvic floor muscle strength to improve complete emptying of stool.  Baseline:  Goal status: INITIAL  4.  Pt will be independent with use of squatty potty, relaxed toileting mechanics, and improved bowel movement techniques in order to increase ease of bowel movements and complete evacuation.   Baseline:  Goal status: INITIAL    LONG TERM GOALS: Target date: 04/05/24  Pt will be independent with advanced HEP.   Baseline:  Goal status: INITIAL  2.  Pt will increase external anal sphincter strength to at least 3/5 in order to decrease fecal incontinence to decrease risk of infection.  Baseline:  Goal status: INITIAL  3.  Pt will report no episodes of fecal incontinence in order to prevent unwanted odors and skin irritation.  Baseline:  Goal status: INITIAL  4.  Pt will report daily bowel movement with complete evacuation and no straining in order to improve low back pain.  Baseline:  Goal status: INITIAL  5.  Pt will decrease PFIQ-7 score to less than 10 in order to demonstrate improved quality of life.  Baseline:  Goal status: INITIAL  6.  Pt will be able to perform squat without bil valgus knee collapse in order to demonstrate functional strength that will decrease risk for falls.  Baseline:  Goal status: INITIAL  PLAN:  PT FREQUENCY: 1-2x/week  PT DURATION: 6 months  PLANNED INTERVENTIONS: 97110-Therapeutic exercises, 97530- Therapeutic activity, 97112- Neuromuscular re-education, 97535- Self Care, 16109- Manual therapy, Dry Needling, and Biofeedback  PLAN FOR NEXT SESSION: begin core training with good pressure management; possible ultrasound,  mobility exercises, scar tissue mobilization   Verlena Glenn, PT, DPT06/18/2512:06 PM

## 2024-01-26 ENCOUNTER — Ambulatory Visit: Payer: Self-pay | Attending: Obstetrics and Gynecology | Admitting: Physical Therapy

## 2024-01-26 DIAGNOSIS — R293 Abnormal posture: Secondary | ICD-10-CM | POA: Insufficient documentation

## 2024-01-26 DIAGNOSIS — M62838 Other muscle spasm: Secondary | ICD-10-CM | POA: Diagnosis present

## 2024-01-26 DIAGNOSIS — M6281 Muscle weakness (generalized): Secondary | ICD-10-CM | POA: Diagnosis present

## 2024-01-26 DIAGNOSIS — M5459 Other low back pain: Secondary | ICD-10-CM | POA: Insufficient documentation

## 2024-01-26 NOTE — Patient Instructions (Signed)
 5 mins 2x daily (AM/PM) circles from right hip to left hip in a square, gentle pressure into abdomen. Shouldn't be painful.

## 2024-01-26 NOTE — Therapy (Signed)
 OUTPATIENT PHYSICAL THERAPY FEMALE PELVIC TREATMENT   Patient Name: Megan Haas MRN: [id number] DOB:1976-05-18, 48 y.o., female Today's Date: 01/26/2024  END OF SESSION:  PT End of Session - 01/26/24 0945     Visit Number 2    Date for PT Re-Evaluation 04/05/24    Authorization Type Medicare Kx at 15    Progress Note Due on Visit 10    PT Start Time 0945   arrival   PT Stop Time 1015    PT Time Calculation (min) 30 min    Activity Tolerance Patient tolerated treatment well    Behavior During Therapy 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   Anxiety    Arthritis    back   Bipolar 1 disorder (HCC)    Follows w/ Dr. Zelda Aurora @ [facility] for biopolar, depression, and anxiety.   COVID-19 07/2020   Depression    Diabetes mellitus without complication (HCC)    Type 2. Pt is taking Ozempic & follows w/ [facility].   Fibromyalgia    Follows w/ PCP, Suzen Downy, NP @ [facility].   History of sleep study    Pt stated she had a sleep study done in 2023 and was told she does not have sleep apnea.   Obesity, Class III, BMI 40-49.9 (morbid obesity)    Follows with [facility].   PTSD (post-traumatic stress disorder)    hx of   Wears glasses    for distance   Past Surgical History:  Procedure Laterality Date   CERVICAL CONIZATION W/BX N/A 08/27/2022   Procedure: CERVIAL BIOPSIES AND ENDOCERVICAL CURRETTAGE;  Surgeon: Viktoria Comer SAUNDERS, MD;  Location: [facility];  Service: Gynecology;  Laterality: N/A;   CHOLECYSTECTOMY  2006   COLONOSCOPY W/ POLYPECTOMY  03/26/2021   multiple colon polyps   DILATATION & CURETTAGE/HYSTEROSCOPY WITH MYOSURE N/A 08/27/2022   Procedure: DILATATION & CURETTAGE/HYSTEROSCOPY WITH MYOSURE;  Surgeon: Viktoria Comer SAUNDERS, MD;   Location: [facility];  Service: Gynecology;  Laterality: N/A;   ESOPHAGOGASTRODUODENOSCOPY  08/18/2018   GASTROPLASTY DUODENAL SWITCH  01/2019   robotically   ROBOTIC ASSISTED LAPAROSCOPIC HYSTERECTOMY AND SALPINGECTOMY Bilateral 09/10/2022   Procedure: XI ROBOTIC ASSISTED LAPAROSCOPIC HYSTERECTOMY AND BILATERAL SALPINGO OOPHORECTOMY;  Surgeon: Viktoria Comer SAUNDERS, MD;  Location: [facility];  Service: Gynecology;  Laterality: Bilateral;   SENTINEL NODE BIOPSY N/A 09/10/2022   Procedure: SENTINEL NODE BIOPSY;  Surgeon: Viktoria Comer SAUNDERS, MD;  Location: [facility];  Service: Gynecology;  Laterality: N/A;   Patient Active Problem List   Diagnosis Date Noted   Genetic testing 11/06/2022   Endometrial cancer (HCC) 09/10/2022   Anxiety 09/04/2022   Adenocarcinoma (HCC) 08/14/2022   Abnormal uterine bleeding 08/14/2022   Obesity, Class III, BMI 40-49.9 (morbid obesity) 08/14/2022   Polyp of colon 05/21/2022   Iron deficiency 12/17/2021   S/P biliopancreatic diversion with duodenal switch 03/06/2019   Bipolar disorder (HCC) 07/11/2018   Morbid obesity (HCC) 07/11/2018   Type 2 diabetes mellitus (HCC) 06/30/2018   Hypersomnia with long sleep time, idiopathic 04/21/2018   Fatigue due to depression 04/21/2018   Snoring 04/21/2018   Post traumatic stress disorder (PTSD) 04/21/2018    PCP: Cristopher Suzen HERO, NP  REFERRING  PROVIDER: Johnnye Ade, MD   REFERRING DIAG: N81.6 (ICD-10-CM) - Rectocele  THERAPY DIAG:  Muscle weakness (generalized)  Abnormal posture  Other muscle spasm  Rationale for Evaluation and Treatment: Rehabilitation  ONSET DATE: 8-10 months ago  SUBJECTIVE:                                                                                                                                                                                           SUBJECTIVE STATEMENT: Has been using coconut oil for moisturizer, doing exercises 1-2x daily. Is having  constipation still and thinks it getting a little worse Reports only a little bit discomfort with intercourse   PAIN:  Are you having pain? Yes NPRS scale: 3/10 Pain location: low back pain (Rt>Lt)  Pain type: tight Pain description: intermittent   Aggravating factors: waking up/getting out of bed Relieving factors: sitting  PRECAUTIONS: Other: history of cancer (colon and endometrial)  RED FLAGS: None   WEIGHT BEARING RESTRICTIONS: No  FALLS:  Has patient fallen in last 6 months? No  OCCUPATION: not working   ACTIVITY LEVEL : no  PLOF: Independent  PATIENT GOALS: strengthen pelvic floor muscles  PERTINENT HISTORY:  Cholecystectomy, gastroplasty with duidenal switch 2020, laparoscopic hysterectomy/salpingectomy 2024, fibromyalgia, depression, diabetes mellitus, anixiety, bipolar 1, obesity, PTSD, adenocarcinoma, endometrial cancer,    BOWEL MOVEMENT: Pain with bowel movement: Yes - can be, but Linzess has really helped  Type of bowel movement:Type (Bristol Stool Scale) 2 or 4, Frequency 1x/day, Strain not usually, and Splinting yes, does use squatty potty Fully empty rectum: Yes: - Leakage: Yes: 2 accidents since starting linzess Pads: No Fiber supplement/laxative Yes linzess  URINATION: Pain with urination: No Fully empty bladder: Yes:   Stream: Strong Urgency: No Frequency: 3-4x/day; rarely wakes at night Fluid Intake:  between 4-64 oz a day Leakage: none Pads: No  INTERCOURSE:  Only had intercourse 1x since hysterectomy and it was painful  PREGNANCY: No children  PROLAPSE: None   OBJECTIVE:  Note: Objective measures were completed at Evaluation unless otherwise noted.   PATIENT SURVEYS:   PFIQ-7: 29  COGNITION: Overall cognitive status: Within functional limits for tasks assessed     SENSATION: Light touch: Appears intact   FUNCTIONAL TESTS:  Squat: Lyt weight shift, bil valgus knee collapse Single leg stance:  Rt: pelvic drop  Lt:  pelvic drop Curl-up test: abdominal distortion   GAIT: Assistive device utilized: None Comments: WNL  POSTURE: rounded shoulders, forward head, increased thoracic kyphosis, decreased thoracic kyphosis, posterior pelvic tilt, and elevated Rt shoulder, possible Rt thoracic curvature   LUMBARAROM/PROM:  A/PROM A/PROM  Eval (% available)  Flexion  50  Extension 50  Right lateral flexion 75  Left lateral flexion 75  Right rotation 50  Left rotation 50   (Blank rows = not tested)  PALPATION:   General: tightness throughout lumbar paraspinals   Pelvic Alignment: WNL, posterior pelvic tilt  Abdominal: decreased rib mobility, apical breathing pattern                 External Perineal Exam: some redness and irritation, several nodules on Lt labia minora                             Internal Pelvic Floor: WNL  Patient confirms identification and approves PT to assess internal pelvic floor and treatment Yes  PELVIC MMT:   MMT eval  Vaginal 2/5, 4 second hold, 10 repeat contractions  Internal Anal Sphincter 1/5  External Anal Sphincter 1/5  Puborectalis 1/5  Diastasis Recti 2 finger width separation   (Blank rows = not tested)        TONE: low  PROLAPSE: Unable to detect in supine, morning  TODAY'S TREATMENT:                                                                                                                              DATE:  12/01/23  EVAL  Neuromuscular re-education: Pt provides verbal consent for internal vaginal/rectal pelvic floor exam. Internal vaginal and rectal pelvic floor muscle contraction training Quick flicks Long holds  Therapeutic activities: Inverted lying position  Squatty potty, posterior lean  Vulvovaginal massage Splinting education - other methods   01/26/24: Reviewed HEP,  Windshield wipers x10 Single knee to chest 3x30s X10 transverse abdominis activations in hooklying - pt able to connect with core well with minimal cues and  good breathing coordination with cues 2x10 opp hand/knee ball press hooklying  Bridges with hip adduction with ball squeeze 2x10 Hooklying hip abduction with blue band +pelvic floor contraction 2x10 Pt educated on abdominal massage for improved peristalsis and decreased constipation    PATIENT EDUCATION:  Education details: See above Person educated: Patient Education method: Programmer, multimedia, Demonstration, Actor cues, Verbal cues, and Handouts Education comprehension: verbalized understanding  HOME EXERCISE PROGRAM: [id number]  ASSESSMENT:  CLINICAL IMPRESSION: Patient is a 48 y.o. female who was seen today for physical therapy treatment for rectocele and pelvic floor muscle weakness. Pt tolerated session well with minimal cues, benefited from these verbal cues for coordination of breathing and core activation as needed, but completed well. Educated to attempt abdominal massage for peristalsis of improved bowel habits and decreased constipation. She will continue to benefit from skilled PT intervention in order to improve ease of bowel movements, address impairments, correct pressure management, and progress functional strengthening program.  OBJECTIVE IMPAIRMENTS: decreased activity tolerance, decreased coordination, decreased endurance, decreased mobility, decreased ROM, decreased strength, increased fascial restrictions, increased muscle spasms, impaired flexibility, impaired tone, improper body mechanics, postural dysfunction, and pain.  ACTIVITY LIMITATIONS: bowel movements  PARTICIPATION LIMITATIONS: toileting  PERSONAL FACTORS: 3+ comorbidities: medical history are also affecting patient's functional outcome.   REHAB POTENTIAL: Good  CLINICAL DECISION MAKING: Evolving/moderate complexity  EVALUATION COMPLEXITY: Moderate   GOALS: Goals reviewed with patient? Yes  SHORT TERM GOALS: Target date: 02/09/2024    Pt will be independent with HEP.   Baseline: Goal status:  INITIAL  2.  Pt increase external anal sphincter strength to 2/5 in order to decrease fecal incontinence to decrease risk of infection.  Baseline:  Goal status: INITIAL  3.  Pt will increase lumbar A/ROM by 25% in order to allow for improved pelvic floor muscle strength to improve complete emptying of stool.  Baseline:  Goal status: INITIAL  4.  Pt will be independent with use of squatty potty, relaxed toileting mechanics, and improved bowel movement techniques in order to increase ease of bowel movements and complete evacuation.   Baseline:  Goal status: INITIAL    LONG TERM GOALS: Target date: 04/05/24  Pt will be independent with advanced HEP.   Baseline:  Goal status: INITIAL  2.  Pt will increase external anal sphincter strength to at least 3/5 in order to decrease fecal incontinence to decrease risk of infection.  Baseline:  Goal status: INITIAL  3.  Pt will report no episodes of fecal incontinence in order to prevent unwanted odors and skin irritation.  Baseline:  Goal status: INITIAL  4.  Pt will report daily bowel movement with complete evacuation and no straining in order to improve low back pain.  Baseline:  Goal status: INITIAL  5.  Pt will decrease PFIQ-7 score to less than 10 in order to demonstrate improved quality of life.  Baseline:  Goal status: INITIAL  6.  Pt will be able to perform squat without bil valgus knee collapse in order to demonstrate functional strength that will decrease risk for falls.  Baseline:  Goal status: INITIAL  PLAN:  PT FREQUENCY: 1-2x/week  PT DURATION: 6 months  PLANNED INTERVENTIONS: 97110-Therapeutic exercises, 97530- Therapeutic activity, 97112- Neuromuscular re-education, 97535- Self Care, [id number]- Manual therapy, Dry Needling, and Biofeedback  PLAN FOR NEXT SESSION: begin core training with good pressure management; possible ultrasound, mobility exercises, scar tissue mobilization   Darryle Navy, PT,  DPT 01/25/2509:54 AM

## 2024-02-02 ENCOUNTER — Ambulatory Visit: Payer: Self-pay | Admitting: Physical Therapy

## 2024-02-02 DIAGNOSIS — M6281 Muscle weakness (generalized): Secondary | ICD-10-CM | POA: Diagnosis not present

## 2024-02-02 DIAGNOSIS — R293 Abnormal posture: Secondary | ICD-10-CM

## 2024-02-02 DIAGNOSIS — M62838 Other muscle spasm: Secondary | ICD-10-CM

## 2024-02-02 DIAGNOSIS — M5459 Other low back pain: Secondary | ICD-10-CM

## 2024-02-02 NOTE — Therapy (Signed)
 OUTPATIENT PHYSICAL THERAPY FEMALE PELVIC TREATMENT   Patient Name: Megan Haas MRN: [id number] DOB:06-23-1976, 48 y.o., female Today's Date: 02/02/2024  END OF SESSION:  PT End of Session - 02/02/24 1623     Visit Number 3    Date for PT Re-Evaluation 04/05/24    Authorization Type Medicare Kx at 15    Progress Note Due on Visit 10    PT Start Time 1623    PT Stop Time 1658    PT Time Calculation (min) 35 min    Activity Tolerance Patient tolerated treatment well    Behavior During Therapy [facility] for tasks assessed/performed          Past Medical History:  Diagnosis Date   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   Anxiety    Arthritis    back   Bipolar 1 disorder (HCC)    Follows w/ Dr. Zelda Aurora @ [facility] for biopolar, depression, and anxiety.   COVID-19 07/2020   Depression    Diabetes mellitus without complication (HCC)    Type 2. Pt is taking Ozempic & follows w/ [facility].   Fibromyalgia    Follows w/ PCP, Suzen Downy, NP @ [facility].   History of sleep study    Pt stated she had a sleep study done in 2023 and was told she does not have sleep apnea.   Obesity, Class III, BMI 40-49.9 (morbid obesity)    Follows with [facility].   PTSD (post-traumatic stress disorder)    hx of   Wears glasses    for distance   Past Surgical History:  Procedure Laterality Date   CERVICAL CONIZATION W/BX N/A 08/27/2022   Procedure: CERVIAL BIOPSIES AND ENDOCERVICAL CURRETTAGE;  Surgeon: Viktoria Comer SAUNDERS, MD;  Location: [facility];  Service: Gynecology;  Laterality: N/A;   CHOLECYSTECTOMY  2006   COLONOSCOPY W/ POLYPECTOMY  03/26/2021   multiple colon polyps   DILATATION & CURETTAGE/HYSTEROSCOPY WITH MYOSURE N/A 08/27/2022   Procedure: DILATATION & CURETTAGE/HYSTEROSCOPY WITH MYOSURE;  Surgeon: Viktoria Comer SAUNDERS, MD;  Location:  [facility];  Service: Gynecology;  Laterality: N/A;   ESOPHAGOGASTRODUODENOSCOPY  08/18/2018   GASTROPLASTY DUODENAL SWITCH  01/2019   robotically   ROBOTIC ASSISTED LAPAROSCOPIC HYSTERECTOMY AND SALPINGECTOMY Bilateral 09/10/2022   Procedure: XI ROBOTIC ASSISTED LAPAROSCOPIC HYSTERECTOMY AND BILATERAL SALPINGO OOPHORECTOMY;  Surgeon: Viktoria Comer SAUNDERS, MD;  Location: [facility];  Service: Gynecology;  Laterality: Bilateral;   SENTINEL NODE BIOPSY N/A 09/10/2022   Procedure: SENTINEL NODE BIOPSY;  Surgeon: Viktoria Comer SAUNDERS, MD;  Location: [facility];  Service: Gynecology;  Laterality: N/A;   Patient Active Problem List   Diagnosis Date Noted   Genetic testing 11/06/2022   Endometrial cancer (HCC) 09/10/2022   Anxiety 09/04/2022   Adenocarcinoma (HCC) 08/14/2022   Abnormal uterine bleeding 08/14/2022   Obesity, Class III, BMI 40-49.9 (morbid obesity) 08/14/2022   Polyp of colon 05/21/2022   Iron deficiency 12/17/2021   S/P biliopancreatic diversion with duodenal switch 03/06/2019   Bipolar disorder (HCC) 07/11/2018   Morbid obesity (HCC) 07/11/2018   Type 2 diabetes mellitus (HCC) 06/30/2018   Hypersomnia with long sleep time, idiopathic 04/21/2018   Fatigue due to depression 04/21/2018   Snoring 04/21/2018   Post traumatic stress disorder (PTSD) 04/21/2018    PCP: Cristopher Suzen HERO, NP  REFERRING PROVIDER: Johnnye,  Richard, MD   REFERRING DIAG: N81.6 (ICD-10-CM) - Rectocele  THERAPY DIAG:  Abnormal posture  Muscle weakness (generalized)  Other muscle spasm  Other low back pain  Rationale for Evaluation and Treatment: Rehabilitation  ONSET DATE: 8-10 months ago  SUBJECTIVE:                                                                                                                                                                                           SUBJECTIVE STATEMENT: Constipation had a small period of 2 days not going however that seemed to be  random because this improved every day and emptying more fully, not straining and doing abdominal massage and step stool daily. But type is variable from type 2 to 7 (starts 2 then goes to 7). No accidents.  Back pain feeling better.  PAIN:  Are you having pain? No NPRS scale: 0/10 Pain location: low back pain (Rt>Lt)  Pain type: tight Pain description: intermittent   Aggravating factors: waking up/getting out of bed Relieving factors: sitting  PRECAUTIONS: Other: history of cancer (colon and endometrial)  RED FLAGS: None   WEIGHT BEARING RESTRICTIONS: No  FALLS:  Has patient fallen in last 6 months? No  OCCUPATION: not working   ACTIVITY LEVEL : no  PLOF: Independent  PATIENT GOALS: strengthen pelvic floor muscles  PERTINENT HISTORY:  Cholecystectomy, gastroplasty with duidenal switch 2020, laparoscopic hysterectomy/salpingectomy 2024, fibromyalgia, depression, diabetes mellitus, anixiety, bipolar 1, obesity, PTSD, adenocarcinoma, endometrial cancer,    BOWEL MOVEMENT: Pain with bowel movement: Yes - can be, but Linzess has really helped  Type of bowel movement:Type (Bristol Stool Scale) 2 or 4, Frequency 1x/day, Strain not usually, and Splinting yes, does use squatty potty Fully empty rectum: Yes: - Leakage: Yes: 2 accidents since starting linzess Pads: No Fiber supplement/laxative Yes linzess  URINATION: Pain with urination: No Fully empty bladder: Yes:   Stream: Strong Urgency: No Frequency: 3-4x/day; rarely wakes at night Fluid Intake:  between 4-64 oz a day Leakage: none Pads: No  INTERCOURSE:  Only had intercourse 1x since hysterectomy and it was painful  PREGNANCY: No children  PROLAPSE: None   OBJECTIVE:  Note: Objective measures were completed at Evaluation unless otherwise noted.   PATIENT SURVEYS:   PFIQ-7: 29  COGNITION: Overall cognitive status: Within functional limits for tasks assessed     SENSATION: Light touch: Appears  intact   FUNCTIONAL TESTS:  Squat: Lyt weight shift, bil valgus knee collapse Single leg stance:  Rt: pelvic drop  Lt: pelvic drop Curl-up test: abdominal distortion   GAIT: Assistive device utilized: None Comments: WNL  POSTURE: rounded shoulders, forward head,  increased thoracic kyphosis, decreased thoracic kyphosis, posterior pelvic tilt, and elevated Rt shoulder, possible Rt thoracic curvature   LUMBARAROM/PROM:  A/PROM A/PROM  Eval (% available)  Flexion 50  Extension 50  Right lateral flexion 75  Left lateral flexion 75  Right rotation 50  Left rotation 50   (Blank rows = not tested)  PALPATION:   General: tightness throughout lumbar paraspinals   Pelvic Alignment: WNL, posterior pelvic tilt  Abdominal: decreased rib mobility, apical breathing pattern                 External Perineal Exam: some redness and irritation, several nodules on Lt labia minora                             Internal Pelvic Floor: WNL  Patient confirms identification and approves PT to assess internal pelvic floor and treatment Yes  PELVIC MMT:   MMT eval  Vaginal 2/5, 4 second hold, 10 repeat contractions  Internal Anal Sphincter 1/5  External Anal Sphincter 1/5  Puborectalis 1/5  Diastasis Recti 2 finger width separation   (Blank rows = not tested)        TONE: low  PROLAPSE: Unable to detect in supine, morning  TODAY'S TREATMENT:                                                                                                                              DATE:  12/01/23  EVAL  Neuromuscular re-education: Pt provides verbal consent for internal vaginal/rectal pelvic floor exam. Internal vaginal and rectal pelvic floor muscle contraction training Quick flicks Long holds  Therapeutic activities: Inverted lying position  Squatty potty, posterior lean  Vulvovaginal massage Splinting education - other methods   01/26/24: Reviewed HEP,  Windshield wipers x10 Single knee  to chest 3x30s X10 transverse abdominis activations in hooklying - pt able to connect with core well with minimal cues and good breathing coordination with cues 2x10 opp hand/knee ball press hooklying  Bridges with hip adduction with ball squeeze 2x10 Hooklying hip abduction with blue band +pelvic floor contraction 2x10 Pt educated on abdominal massage for improved peristalsis and decreased constipation   02/02/24: Abdomen massage x5, m method x5, x5 ILY massage in supine X10 windshield wipers Single knee to chest 3x30s Opp hand/ball press alt with 3s isometric with press 2x10  Hooklying hip abduction blue band 2x10 + transverse abdominis activation and exhale  Bridges 2x10 + exhale and transverse abdominis activation  Sidelying ball press with hip abduction 2x10 each  PATIENT EDUCATION:  Education details: Actor Person educated: Patient Education method: Programmer, multimedia, Demonstration, Actor cues, Verbal cues, and Handouts Education comprehension: verbalized understanding  HOME EXERCISE PROGRAM: [id number]  ASSESSMENT:  CLINICAL IMPRESSION: Patient is a 48 y.o. female who was seen today for physical therapy treatment for rectocele and pelvic floor muscle weakness. Pt tolerated session well with minimal cues, benefited from these verbal  cues for coordination of breathing and core activation as needed, but completed well. She will continue to benefit from skilled PT intervention in order to improve ease of bowel movements, address impairments, correct pressure management, and progress functional strengthening program.  OBJECTIVE IMPAIRMENTS: decreased activity tolerance, decreased coordination, decreased endurance, decreased mobility, decreased ROM, decreased strength, increased fascial restrictions, increased muscle spasms, impaired flexibility, impaired tone, improper body mechanics, postural dysfunction, and pain.   ACTIVITY LIMITATIONS: bowel movements  PARTICIPATION LIMITATIONS:  toileting  PERSONAL FACTORS: 3+ comorbidities: medical history are also affecting patient's functional outcome.   REHAB POTENTIAL: Good  CLINICAL DECISION MAKING: Evolving/moderate complexity  EVALUATION COMPLEXITY: Moderate   GOALS: Goals reviewed with patient? Yes  SHORT TERM GOALS: Target date: 02/09/2024    Pt will be independent with HEP.   Baseline: Goal status: INITIAL  2.  Pt increase external anal sphincter strength to 2/5 in order to decrease fecal incontinence to decrease risk of infection.  Baseline:  Goal status: INITIAL  3.  Pt will increase lumbar A/ROM by 25% in order to allow for improved pelvic floor muscle strength to improve complete emptying of stool.  Baseline:  Goal status: INITIAL  4.  Pt will be independent with use of squatty potty, relaxed toileting mechanics, and improved bowel movement techniques in order to increase ease of bowel movements and complete evacuation.   Baseline:  Goal status: INITIAL    LONG TERM GOALS: Target date: 04/05/24  Pt will be independent with advanced HEP.   Baseline:  Goal status: INITIAL  2.  Pt will increase external anal sphincter strength to at least 3/5 in order to decrease fecal incontinence to decrease risk of infection.  Baseline:  Goal status: INITIAL  3.  Pt will report no episodes of fecal incontinence in order to prevent unwanted odors and skin irritation.  Baseline:  Goal status: INITIAL  4.  Pt will report daily bowel movement with complete evacuation and no straining in order to improve low back pain.  Baseline:  Goal status: INITIAL  5.  Pt will decrease PFIQ-7 score to less than 10 in order to demonstrate improved quality of life.  Baseline:  Goal status: INITIAL  6.  Pt will be able to perform squat without bil valgus knee collapse in order to demonstrate functional strength that will decrease risk for falls.  Baseline:  Goal status: INITIAL  PLAN:  PT FREQUENCY: 1-2x/week  PT  DURATION: 6 months  PLANNED INTERVENTIONS: 97110-Therapeutic exercises, 97530- Therapeutic activity, 97112- Neuromuscular re-education, 97535- Self Care, [id number]- Manual therapy, Dry Needling, and Biofeedback  PLAN FOR NEXT SESSION: begin core training with good pressure management; possible ultrasound, mobility exercises, scar tissue mobilization   Darryle Navy, PT, DPT 07/09/255:00 PM

## 2024-02-08 ENCOUNTER — Ambulatory Visit: Payer: Self-pay | Admitting: Physical Therapy

## 2024-02-14 ENCOUNTER — Ambulatory Visit

## 2024-02-14 DIAGNOSIS — R293 Abnormal posture: Secondary | ICD-10-CM

## 2024-02-14 DIAGNOSIS — M6281 Muscle weakness (generalized): Secondary | ICD-10-CM

## 2024-02-14 DIAGNOSIS — M5459 Other low back pain: Secondary | ICD-10-CM

## 2024-02-14 DIAGNOSIS — M62838 Other muscle spasm: Secondary | ICD-10-CM

## 2024-02-14 NOTE — Therapy (Signed)
 OUTPATIENT PHYSICAL THERAPY FEMALE PELVIC TREATMENT   Patient Name: Megan Haas MRN: [id number] DOB:Sep 06, 1975, 48 y.o., female Today's Date: 02/14/2024  END OF SESSION:  PT End of Session - 02/14/24 1445     Visit Number 4    Date for PT Re-Evaluation 04/05/24    Authorization Type Medicare Kx at 15    Progress Note Due on Visit 10    PT Start Time 1445    PT Stop Time 1525    PT Time Calculation (min) 40 min    Activity Tolerance Patient tolerated treatment well    Behavior During Therapy [facility] for tasks assessed/performed          Past Medical History:  Diagnosis Date   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   Anxiety    Arthritis    back   Bipolar 1 disorder (HCC)    Follows w/ Dr. Zelda Aurora @ [facility] for biopolar, depression, and anxiety.   COVID-19 07/2020   Depression    Diabetes mellitus without complication (HCC)    Type 2. Pt is taking Ozempic & follows w/ [facility].   Fibromyalgia    Follows w/ PCP, Suzen Downy, NP @ [facility].   History of sleep study    Pt stated she had a sleep study done in 2023 and was told she does not have sleep apnea.   Obesity, Class III, BMI 40-49.9 (morbid obesity)    Follows with [facility].   PTSD (post-traumatic stress disorder)    hx of   Wears glasses    for distance   Past Surgical History:  Procedure Laterality Date   CERVICAL CONIZATION W/BX N/A 08/27/2022   Procedure: CERVIAL BIOPSIES AND ENDOCERVICAL CURRETTAGE;  Surgeon: Viktoria Comer SAUNDERS, MD;  Location: [facility];  Service: Gynecology;  Laterality: N/A;   CHOLECYSTECTOMY  2006   COLONOSCOPY W/ POLYPECTOMY  03/26/2021   multiple colon polyps   DILATATION & CURETTAGE/HYSTEROSCOPY WITH MYOSURE N/A 08/27/2022   Procedure: DILATATION & CURETTAGE/HYSTEROSCOPY WITH MYOSURE;  Surgeon: Viktoria Comer SAUNDERS, MD;  Location:  [facility];  Service: Gynecology;  Laterality: N/A;   ESOPHAGOGASTRODUODENOSCOPY  08/18/2018   GASTROPLASTY DUODENAL SWITCH  01/2019   robotically   ROBOTIC ASSISTED LAPAROSCOPIC HYSTERECTOMY AND SALPINGECTOMY Bilateral 09/10/2022   Procedure: XI ROBOTIC ASSISTED LAPAROSCOPIC HYSTERECTOMY AND BILATERAL SALPINGO OOPHORECTOMY;  Surgeon: Viktoria Comer SAUNDERS, MD;  Location: [facility];  Service: Gynecology;  Laterality: Bilateral;   SENTINEL NODE BIOPSY N/A 09/10/2022   Procedure: SENTINEL NODE BIOPSY;  Surgeon: Viktoria Comer SAUNDERS, MD;  Location: [facility];  Service: Gynecology;  Laterality: N/A;   Patient Active Problem List   Diagnosis Date Noted   Genetic testing 11/06/2022   Endometrial cancer (HCC) 09/10/2022   Anxiety 09/04/2022   Adenocarcinoma (HCC) 08/14/2022   Abnormal uterine bleeding 08/14/2022   Obesity, Class III, BMI 40-49.9 (morbid obesity) 08/14/2022   Polyp of colon 05/21/2022   Iron deficiency 12/17/2021   S/P biliopancreatic diversion with duodenal switch 03/06/2019   Bipolar disorder (HCC) 07/11/2018   Morbid obesity (HCC) 07/11/2018   Type 2 diabetes mellitus (HCC) 06/30/2018   Hypersomnia with long sleep time, idiopathic 04/21/2018   Fatigue due to depression 04/21/2018   Snoring 04/21/2018   Post traumatic stress disorder (PTSD) 04/21/2018    PCP: Cristopher Suzen HERO, NP  REFERRING PROVIDER: Johnnye,  Richard, MD   REFERRING DIAG: N81.6 (ICD-10-CM) - Rectocele  THERAPY DIAG:  Abnormal posture  Muscle weakness (generalized)  Other muscle spasm  Other low back pain  Rationale for Evaluation and Treatment: Rehabilitation  ONSET DATE: 8-10 months ago  SUBJECTIVE:                                                                                                                                                                                           SUBJECTIVE STATEMENT: Pt states that after vacation recently she became very constipated. She feels  like abdominal massage has been very helpful and she is drinking more fluids. She is reporting a lot of inconsistency of stool texture. She feels like she is 15% better; further progress would feel like bowel movements were consistent in texture and having one daily instead of skipping days.   PAIN: 02/14/24 Are you having pain? No NPRS scale: 0/10 Pain location: low back pain (Rt>Lt)  Pain type: tight Pain description: intermittent   Aggravating factors: waking up/getting out of bed Relieving factors: sitting  PRECAUTIONS: Other: history of cancer (colon and endometrial)  RED FLAGS: None   WEIGHT BEARING RESTRICTIONS: No  FALLS:  Has patient fallen in last 6 months? No  OCCUPATION: not working   ACTIVITY LEVEL : no  PLOF: Independent  PATIENT GOALS: strengthen pelvic floor muscles  PERTINENT HISTORY:  Cholecystectomy, gastroplasty with duidenal switch 2020, laparoscopic hysterectomy/salpingectomy 2024, fibromyalgia, depression, diabetes mellitus, anixiety, bipolar 1, obesity, PTSD, adenocarcinoma, endometrial cancer,    BOWEL MOVEMENT: Pain with bowel movement: Yes - can be, but Linzess has really helped  Type of bowel movement:Type (Bristol Stool Scale) 2 or 4, Frequency 1x/day, Strain not usually, and Splinting yes, does use squatty potty Fully empty rectum: Yes: - Leakage: Yes: 2 accidents since starting linzess Pads: No Fiber supplement/laxative Yes linzess  URINATION: Pain with urination: No Fully empty bladder: Yes:   Stream: Strong Urgency: No Frequency: 3-4x/day; rarely wakes at night Fluid Intake:  between 4-64 oz a day Leakage: none Pads: No  INTERCOURSE:  Only had intercourse 1x since hysterectomy and it was painful  PREGNANCY: No children  PROLAPSE: None   OBJECTIVE:  Note: Objective measures were completed at Evaluation unless otherwise noted.   PATIENT SURVEYS:   PFIQ-7: 29  COGNITION: Overall cognitive status: Within functional  limits for tasks assessed     SENSATION: Light touch: Appears intact   FUNCTIONAL TESTS:  Squat: Lyt weight shift, bil valgus knee collapse Single leg stance:  Rt: pelvic drop  Lt: pelvic drop Curl-up test: abdominal distortion   GAIT: Assistive device utilized: None Comments: WNL  POSTURE: rounded shoulders, forward head, increased thoracic kyphosis, decreased thoracic kyphosis, posterior pelvic tilt, and elevated Rt shoulder, possible Rt thoracic curvature   LUMBARAROM/PROM:  A/PROM A/PROM  Eval (% available)  Flexion 50  Extension 50  Right lateral flexion 75  Left lateral flexion 75  Right rotation 50  Left rotation 50   (Blank rows = not tested)  PALPATION:   General: tightness throughout lumbar paraspinals   Pelvic Alignment: WNL, posterior pelvic tilt  Abdominal: decreased rib mobility, apical breathing pattern                 External Perineal Exam: some redness and irritation, several nodules on Lt labia minora                             Internal Pelvic Floor: WNL  Patient confirms identification and approves PT to assess internal pelvic floor and treatment Yes  PELVIC MMT:   MMT eval  Vaginal 2/5, 4 second hold, 10 repeat contractions  Internal Anal Sphincter 1/5  External Anal Sphincter 1/5  Puborectalis 1/5  Diastasis Recti 2 finger width separation   (Blank rows = not tested)        TONE: low  PROLAPSE: Unable to detect in supine, morning  TODAY'S TREATMENT:                                                                                                                              DATE:  01/26/24: Reviewed HEP,  Windshield wipers x10 Single knee to chest 3x30s X10 transverse abdominis activations in hooklying - pt able to connect with core well with minimal cues and good breathing coordination with cues 2x10 opp hand/knee ball press hooklying  Bridges with hip adduction with ball squeeze 2x10 Hooklying hip abduction with blue band  +pelvic floor contraction 2x10 Pt educated on abdominal massage for improved peristalsis and decreased constipation   02/02/24: Abdomen massage x5, m method x5, x5 ILY massage in supine X10 windshield wipers Single knee to chest 3x30s Opp hand/ball press alt with 3s isometric with press 2x10  Hooklying hip abduction blue band 2x10 + transverse abdominis activation and exhale  Bridges 2x10 + exhale and transverse abdominis activation  Sidelying ball press with hip abduction 2x10 each  02/14/24 Manual: Ileocecal valve release 10x in supine ILU bowel mobilization  in supine Midline upper abdominal scar tissue restriction  Neuromuscular re-education: Bridge with hip adduction, transversus abdominus, and pelvic floor muscle 2 x 10 Unilateral side lying UE ball press and hip abduction with transversus abdominus and pelvic floor muscle contractions and breath coordination 10x Supine leg extensions 10x  Exercises: Single knee to chest 3x bil Kneeling hip flexor stretch 60 sec bil Supine piriformis stretch 60 sec bil Therapeutic activities: Fiber education Posterior lean with bowel movement Exhale with push during bowel movement   PATIENT EDUCATION:  Education details: [id number] Person educated: Patient Education method:  Explanation, Demonstration, Tactile cues, Verbal cues, and Handouts Education comprehension: verbalized understanding  HOME EXERCISE PROGRAM: [id number]  ASSESSMENT:  CLINICAL IMPRESSION: Patient is a 48 y.o. female who was seen today for physical therapy treatment for rectocele and pelvic floor muscle weakness. Pt overall making some progress and reports 15% improvement. We went over several different bowel mobilization techniques for her to try at home. Good tolerance to both. She was able to progress strengthening and mobility activities with cues for improved core activation with good tolerance. Hep updated. She will continue to benefit from skilled PT intervention  in order to improve ease of bowel movements, address impairments, correct pressure management, and progress functional strengthening program.  OBJECTIVE IMPAIRMENTS: decreased activity tolerance, decreased coordination, decreased endurance, decreased mobility, decreased ROM, decreased strength, increased fascial restrictions, increased muscle spasms, impaired flexibility, impaired tone, improper body mechanics, postural dysfunction, and pain.   ACTIVITY LIMITATIONS: bowel movements  PARTICIPATION LIMITATIONS: toileting  PERSONAL FACTORS: 3+ comorbidities: medical history are also affecting patient's functional outcome.   REHAB POTENTIAL: Good  CLINICAL DECISION MAKING: Evolving/moderate complexity  EVALUATION COMPLEXITY: Moderate   GOALS: Goals reviewed with patient? Yes  SHORT TERM GOALS: Updated 02/14/24    Pt will be independent with HEP.   Baseline: Goal status: MET 02/14/24  2.  Pt increase external anal sphincter strength to 2/5 in order to decrease fecal incontinence to decrease risk of infection.  Baseline:  Goal status: IN PROGRESS 02/14/24  3.  Pt will increase lumbar A/ROM by 25% in order to allow for improved pelvic floor muscle strength to improve complete emptying of stool.  Baseline:  Goal status: IN PROGRESS 02/14/24  4.  Pt will be independent with use of squatty potty, relaxed toileting mechanics, and improved bowel movement techniques in order to increase ease of bowel movements and complete evacuation.   Baseline:  Goal status: IN PROGRESS 02/14/24    LONG TERM GOALS: Updated 02/14/24  Pt will be independent with advanced HEP.   Baseline:  Goal status: IN PROGRESS 02/14/24  2.  Pt will increase external anal sphincter strength to at least 3/5 in order to decrease fecal incontinence to decrease risk of infection.  Baseline:  Goal status: IN PROGRESS 02/14/24  3.  Pt will report no episodes of fecal incontinence in order to prevent unwanted odors and  skin irritation.  Baseline:  Goal status: IN PROGRESS 02/14/24  4.  Pt will report daily bowel movement with complete evacuation and no straining in order to improve low back pain.  Baseline:  Goal status: IN PROGRESS 02/14/24  5.  Pt will decrease PFIQ-7 score to less than 10 in order to demonstrate improved quality of life.  Baseline:  Goal status: IN PROGRESS 02/14/24  6.  Pt will be able to perform squat without bil valgus knee collapse in order to demonstrate functional strength that will decrease risk for falls.  Baseline:  Goal status: IN PROGRESS 02/14/24  PLAN:  PT FREQUENCY: 1-2x/week  PT DURATION: 6 months  PLANNED INTERVENTIONS: 97110-Therapeutic exercises, 97530- Therapeutic activity, 97112- Neuromuscular re-education, 97535- Self Care, [id number]- Manual therapy, Dry Needling, and Biofeedback  PLAN FOR NEXT SESSION: begin core training with good pressure management; possible ultrasound, mobility exercises, scar tissue mobilization   Josette Mares, PT, DPT07/21/253:29 PM

## 2024-02-23 ENCOUNTER — Ambulatory Visit: Admitting: Physical Therapy

## 2024-02-23 DIAGNOSIS — M5459 Other low back pain: Secondary | ICD-10-CM

## 2024-02-23 DIAGNOSIS — R293 Abnormal posture: Secondary | ICD-10-CM

## 2024-02-23 DIAGNOSIS — M6281 Muscle weakness (generalized): Secondary | ICD-10-CM | POA: Diagnosis not present

## 2024-02-23 DIAGNOSIS — M62838 Other muscle spasm: Secondary | ICD-10-CM

## 2024-02-23 NOTE — Therapy (Signed)
 OUTPATIENT PHYSICAL THERAPY FEMALE PELVIC TREATMENT   Patient Name: Megan Haas MRN: [id number] DOB:Nov 07, 1975, 48 y.o., female Today's Date: 02/23/2024  END OF SESSION:  PT End of Session - 02/23/24 1543     Visit Number 5    Date for PT Re-Evaluation 04/05/24    Authorization Type Medicare Kx at 15    Progress Note Due on Visit 10    PT Start Time 1543   arrival   PT Stop Time 1615    PT Time Calculation (min) 32 min    Activity Tolerance Patient tolerated treatment well    Behavior During Therapy [facility] for tasks assessed/performed          Past Medical History:  Diagnosis Date   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   Anxiety    Arthritis    back   Bipolar 1 disorder (HCC)    Follows w/ Dr. Zelda Aurora @ [facility] for biopolar, depression, and anxiety.   COVID-19 07/2020   Depression    Diabetes mellitus without complication (HCC)    Type 2. Pt is taking Ozempic & follows w/ [facility].   Fibromyalgia    Follows w/ PCP, Suzen Downy, NP @ [facility].   History of sleep study    Pt stated she had a sleep study done in 2023 and was told she does not have sleep apnea.   Obesity, Class III, BMI 40-49.9 (morbid obesity)    Follows with [facility].   PTSD (post-traumatic stress disorder)    hx of   Wears glasses    for distance   Past Surgical History:  Procedure Laterality Date   CERVICAL CONIZATION W/BX N/A 08/27/2022   Procedure: CERVIAL BIOPSIES AND ENDOCERVICAL CURRETTAGE;  Surgeon: Viktoria Comer SAUNDERS, MD;  Location: [facility];  Service: Gynecology;  Laterality: N/A;   CHOLECYSTECTOMY  2006   COLONOSCOPY W/ POLYPECTOMY  03/26/2021   multiple colon polyps   DILATATION & CURETTAGE/HYSTEROSCOPY WITH MYOSURE N/A 08/27/2022   Procedure: DILATATION & CURETTAGE/HYSTEROSCOPY WITH MYOSURE;  Surgeon: Viktoria Comer SAUNDERS, MD;   Location: [facility];  Service: Gynecology;  Laterality: N/A;   ESOPHAGOGASTRODUODENOSCOPY  08/18/2018   GASTROPLASTY DUODENAL SWITCH  01/2019   robotically   ROBOTIC ASSISTED LAPAROSCOPIC HYSTERECTOMY AND SALPINGECTOMY Bilateral 09/10/2022   Procedure: XI ROBOTIC ASSISTED LAPAROSCOPIC HYSTERECTOMY AND BILATERAL SALPINGO OOPHORECTOMY;  Surgeon: Viktoria Comer SAUNDERS, MD;  Location: [facility];  Service: Gynecology;  Laterality: Bilateral;   SENTINEL NODE BIOPSY N/A 09/10/2022   Procedure: SENTINEL NODE BIOPSY;  Surgeon: Viktoria Comer SAUNDERS, MD;  Location: [facility];  Service: Gynecology;  Laterality: N/A;   Patient Active Problem List   Diagnosis Date Noted   Genetic testing 11/06/2022   Endometrial cancer (HCC) 09/10/2022   Anxiety 09/04/2022   Adenocarcinoma (HCC) 08/14/2022   Abnormal uterine bleeding 08/14/2022   Obesity, Class III, BMI 40-49.9 (morbid obesity) 08/14/2022   Polyp of colon 05/21/2022   Iron deficiency 12/17/2021   S/P biliopancreatic diversion with duodenal switch 03/06/2019   Bipolar disorder (HCC) 07/11/2018   Morbid obesity (HCC) 07/11/2018   Type 2 diabetes mellitus (HCC) 06/30/2018   Hypersomnia with long sleep time, idiopathic 04/21/2018   Fatigue due to depression 04/21/2018   Snoring 04/21/2018   Post traumatic stress disorder (PTSD) 04/21/2018    PCP: Cristopher Suzen HERO, NP  REFERRING  PROVIDER: Johnnye Ade, MD   REFERRING DIAG: N81.6 (ICD-10-CM) - Rectocele  THERAPY DIAG:  Abnormal posture  Muscle weakness (generalized)  Other muscle spasm  Other low back pain  Rationale for Evaluation and Treatment: Rehabilitation  ONSET DATE: 8-10 months ago  SUBJECTIVE:                                                                                                                                                                                           SUBJECTIVE STATEMENT: One bowel movement daily, no straining not painful, and feels  complete. Has been doing abdominal massage. Now feels about 25-30% better. Reports she wants to have more type 4 stools usually type 1 or 7 and hoping to be more 4s.   PAIN: 02/23/24 Are you having pain? No NPRS scale: 0/10 Pain location: low back pain (Rt>Lt)  Pain type: tight Pain description: intermittent   Aggravating factors: waking up/getting out of bed Relieving factors: sitting  PRECAUTIONS: Other: history of cancer (uterine)  RED FLAGS: None   WEIGHT BEARING RESTRICTIONS: No  FALLS:  Has patient fallen in last 6 months? No  OCCUPATION: not working   ACTIVITY LEVEL : no  PLOF: Independent  PATIENT GOALS: strengthen pelvic floor muscles  PERTINENT HISTORY:  Cholecystectomy, gastroplasty with duidenal switch 2020, laparoscopic hysterectomy/salpingectomy 2024, fibromyalgia, depression, diabetes mellitus, anixiety, bipolar 1, obesity, PTSD, adenocarcinoma, endometrial cancer,    BOWEL MOVEMENT: Pain with bowel movement: Yes - can be, but Linzess has really helped  Type of bowel movement:Type (Bristol Stool Scale) 2 or 4, Frequency 1x/day, Strain not usually, and Splinting yes, does use squatty potty Fully empty rectum: Yes: - Leakage: Yes: 2 accidents since starting linzess Pads: No Fiber supplement/laxative Yes linzess  URINATION: Pain with urination: No Fully empty bladder: Yes:   Stream: Strong Urgency: No Frequency: 3-4x/day; rarely wakes at night Fluid Intake:  between 4-64 oz a day Leakage: none Pads: No  INTERCOURSE:  Only had intercourse 1x since hysterectomy and it was painful  PREGNANCY: No children  PROLAPSE: None   OBJECTIVE:  Note: Objective measures were completed at Evaluation unless otherwise noted.   PATIENT SURVEYS:   PFIQ-7: 29  COGNITION: Overall cognitive status: Within functional limits for tasks assessed     SENSATION: Light touch: Appears intact   FUNCTIONAL TESTS:  Squat: Lyt weight shift, bil valgus knee  collapse Single leg stance:  Rt: pelvic drop  Lt: pelvic drop Curl-up test: abdominal distortion   GAIT: Assistive device utilized: None Comments: WNL  POSTURE: rounded shoulders, forward head, increased thoracic kyphosis, decreased thoracic kyphosis, posterior pelvic tilt, and elevated Rt shoulder, possible Rt  thoracic curvature   LUMBARAROM/PROM:  A/PROM A/PROM  Eval (% available)  Flexion 50  Extension 50  Right lateral flexion 75  Left lateral flexion 75  Right rotation 50  Left rotation 50   (Blank rows = not tested)  PALPATION:   General: tightness throughout lumbar paraspinals   Pelvic Alignment: WNL, posterior pelvic tilt  Abdominal: decreased rib mobility, apical breathing pattern                 External Perineal Exam: some redness and irritation, several nodules on Lt labia minora                             Internal Pelvic Floor: WNL  Patient confirms identification and approves PT to assess internal pelvic floor and treatment Yes  PELVIC MMT:   MMT eval  Vaginal 2/5, 4 second hold, 10 repeat contractions  Internal Anal Sphincter 1/5  External Anal Sphincter 1/5  Puborectalis 1/5  Diastasis Recti 2 finger width separation   (Blank rows = not tested)        TONE: low  PROLAPSE: Unable to detect in supine, morning  TODAY'S TREATMENT:                                                                                                                              DATE:   02/02/24: Abdomen massage x5, m method x5, x5 ILY massage in supine X10 windshield wipers Single knee to chest 3x30s Opp hand/ball press alt with 3s isometric with press 2x10  Hooklying hip abduction blue band 2x10 + transverse abdominis activation and exhale  Bridges 2x10 + exhale and transverse abdominis activation  Sidelying ball press with hip abduction 2x10 each  02/14/24 Manual: Ileocecal valve release 10x in supine ILU bowel mobilization  in supine Midline upper  abdominal scar tissue restriction  Neuromuscular re-education: Bridge with hip adduction, transversus abdominus, and pelvic floor muscle 2 x 10 Unilateral side lying UE ball press and hip abduction with transversus abdominus and pelvic floor muscle contractions and breath coordination 10x Supine leg extensions 10x  Exercises: Single knee to chest 3x bil Kneeling hip flexor stretch 60 sec bil Supine piriformis stretch 60 sec bil Therapeutic activities: Fiber education Posterior lean with bowel movement Exhale with push during bowel movement 02/23/24: Butterfly stretch 3x30s > unilateral butterfly 2x30s each Single knee to chest 3x30s 2x10 opposite hand/knee ball press hooklying  X10 blue band hip abduction and marching in hooklying  Bridges 2x10 with hip adductor squeeze Quad fire hydrants x10 each  PATIENT EDUCATION:  Education details: Actor Person educated: Patient Education method: Programmer, multimedia, Demonstration, Actor cues, Verbal cues, and Handouts Education comprehension: verbalized understanding  HOME EXERCISE PROGRAM: [id number]  ASSESSMENT:  CLINICAL IMPRESSION: Patient is a 48 y.o. female who was seen today for physical therapy treatment for rectocele and pelvic floor muscle weakness. Pt overall making some progress and reports 25-30%  improvement. Pt tolerated session well with continuing hip and core strengthening with increased band resistance as needed and minimal cues for proper techniques and more quad posterior pelvic floor based strengthening today as well. Pt is progressing She will continue to benefit from skilled PT intervention in order to improve ease of bowel movements, address impairments, correct pressure management, and progress functional strengthening program.  OBJECTIVE IMPAIRMENTS: decreased activity tolerance, decreased coordination, decreased endurance, decreased mobility, decreased ROM, decreased strength, increased fascial restrictions, increased  muscle spasms, impaired flexibility, impaired tone, improper body mechanics, postural dysfunction, and pain.   ACTIVITY LIMITATIONS: bowel movements  PARTICIPATION LIMITATIONS: toileting  PERSONAL FACTORS: 3+ comorbidities: medical history are also affecting patient's functional outcome.   REHAB POTENTIAL: Good  CLINICAL DECISION MAKING: Evolving/moderate complexity  EVALUATION COMPLEXITY: Moderate   GOALS: Goals reviewed with patient? Yes  SHORT TERM GOALS: Updated 02/14/24    Pt will be independent with HEP.   Baseline: Goal status: MET 02/14/24  2.  Pt increase external anal sphincter strength to 2/5 in order to decrease fecal incontinence to decrease risk of infection.  Baseline:  Goal status: IN PROGRESS 02/14/24  3.  Pt will increase lumbar A/ROM by 25% in order to allow for improved pelvic floor muscle strength to improve complete emptying of stool.  Baseline:  Goal status: IN PROGRESS 02/14/24  4.  Pt will be independent with use of squatty potty, relaxed toileting mechanics, and improved bowel movement techniques in order to increase ease of bowel movements and complete evacuation.   Baseline:  Goal status: IN PROGRESS 02/14/24    LONG TERM GOALS: Updated 02/14/24  Pt will be independent with advanced HEP.   Baseline:  Goal status: IN PROGRESS 02/14/24  2.  Pt will increase external anal sphincter strength to at least 3/5 in order to decrease fecal incontinence to decrease risk of infection.  Baseline:  Goal status: IN PROGRESS 02/14/24  3.  Pt will report no episodes of fecal incontinence in order to prevent unwanted odors and skin irritation.  Baseline:  Goal status: IN PROGRESS 02/14/24  4.  Pt will report daily bowel movement with complete evacuation and no straining in order to improve low back pain.  Baseline:  Goal status: IN PROGRESS 02/14/24  5.  Pt will decrease PFIQ-7 score to less than 10 in order to demonstrate improved quality of life.   Baseline:  Goal status: IN PROGRESS 02/14/24  6.  Pt will be able to perform squat without bil valgus knee collapse in order to demonstrate functional strength that will decrease risk for falls.  Baseline:  Goal status: IN PROGRESS 02/14/24  PLAN:  PT FREQUENCY: 1-2x/week  PT DURATION: 6 months  PLANNED INTERVENTIONS: 97110-Therapeutic exercises, 97530- Therapeutic activity, 97112- Neuromuscular re-education, 97535- Self Care, [id number]- Manual therapy, Dry Needling, and Biofeedback  PLAN FOR NEXT SESSION: begin core training with good pressure management; possible ultrasound, mobility exercises, scar tissue mobilization   Darryle Navy, PT, DPT 07/30/254:59 PM  [facility] [address], [address] [place], [place] [address] Phone # [phone] Fax [phone]

## 2024-03-01 ENCOUNTER — Ambulatory Visit: Attending: Obstetrics and Gynecology

## 2024-03-01 DIAGNOSIS — R293 Abnormal posture: Secondary | ICD-10-CM | POA: Diagnosis present

## 2024-03-01 DIAGNOSIS — M5459 Other low back pain: Secondary | ICD-10-CM | POA: Insufficient documentation

## 2024-03-01 DIAGNOSIS — M62838 Other muscle spasm: Secondary | ICD-10-CM | POA: Diagnosis present

## 2024-03-01 DIAGNOSIS — M6281 Muscle weakness (generalized): Secondary | ICD-10-CM | POA: Diagnosis present

## 2024-03-01 NOTE — Therapy (Signed)
 OUTPATIENT PHYSICAL THERAPY FEMALE PELVIC TREATMENT   Patient Name: Megan Haas MRN: [id number] DOB:October 25, 1975, 48 y.o., female Today's Date: 03/01/2024  END OF SESSION:  PT End of Session - 03/01/24 1235     Visit Number 6    Date for PT Re-Evaluation 04/05/24    Authorization Type Medicare Kx at 15    Progress Note Due on Visit 10    PT Start Time 1234    PT Stop Time 1312    PT Time Calculation (min) 38 min    Activity Tolerance Patient tolerated treatment well    Behavior During Therapy [facility] for tasks assessed/performed          Past Medical History:  Diagnosis Date   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   Anxiety    Arthritis    back   Bipolar 1 disorder (HCC)    Follows w/ Dr. Zelda Aurora @ [facility] for biopolar, depression, and anxiety.   COVID-19 07/2020   Depression    Diabetes mellitus without complication (HCC)    Type 2. Pt is taking Ozempic & follows w/ [facility].   Fibromyalgia    Follows w/ PCP, Suzen Downy, NP @ [facility].   History of sleep study    Pt stated she had a sleep study done in 2023 and was told she does not have sleep apnea.   Obesity, Class III, BMI 40-49.9 (morbid obesity)    Follows with [facility].   PTSD (post-traumatic stress disorder)    hx of   Wears glasses    for distance   Past Surgical History:  Procedure Laterality Date   CERVICAL CONIZATION W/BX N/A 08/27/2022   Procedure: CERVIAL BIOPSIES AND ENDOCERVICAL CURRETTAGE;  Surgeon: Viktoria Comer SAUNDERS, MD;  Location: [facility];  Service: Gynecology;  Laterality: N/A;   CHOLECYSTECTOMY  2006   COLONOSCOPY W/ POLYPECTOMY  03/26/2021   multiple colon polyps   DILATATION & CURETTAGE/HYSTEROSCOPY WITH MYOSURE N/A 08/27/2022   Procedure: DILATATION & CURETTAGE/HYSTEROSCOPY WITH MYOSURE;  Surgeon: Viktoria Comer SAUNDERS, MD;  Location:  [facility];  Service: Gynecology;  Laterality: N/A;   ESOPHAGOGASTRODUODENOSCOPY  08/18/2018   GASTROPLASTY DUODENAL SWITCH  01/2019   robotically   ROBOTIC ASSISTED LAPAROSCOPIC HYSTERECTOMY AND SALPINGECTOMY Bilateral 09/10/2022   Procedure: XI ROBOTIC ASSISTED LAPAROSCOPIC HYSTERECTOMY AND BILATERAL SALPINGO OOPHORECTOMY;  Surgeon: Viktoria Comer SAUNDERS, MD;  Location: [facility];  Service: Gynecology;  Laterality: Bilateral;   SENTINEL NODE BIOPSY N/A 09/10/2022   Procedure: SENTINEL NODE BIOPSY;  Surgeon: Viktoria Comer SAUNDERS, MD;  Location: [facility];  Service: Gynecology;  Laterality: N/A;   Patient Active Problem List   Diagnosis Date Noted   Genetic testing 11/06/2022   Endometrial cancer (HCC) 09/10/2022   Anxiety 09/04/2022   Adenocarcinoma (HCC) 08/14/2022   Abnormal uterine bleeding 08/14/2022   Obesity, Class III, BMI 40-49.9 (morbid obesity) 08/14/2022   Polyp of colon 05/21/2022   Iron deficiency 12/17/2021   S/P biliopancreatic diversion with duodenal switch 03/06/2019   Bipolar disorder (HCC) 07/11/2018   Morbid obesity (HCC) 07/11/2018   Type 2 diabetes mellitus (HCC) 06/30/2018   Hypersomnia with long sleep time, idiopathic 04/21/2018   Fatigue due to depression 04/21/2018   Snoring 04/21/2018   Post traumatic stress disorder (PTSD) 04/21/2018    PCP: Cristopher Suzen HERO, NP  REFERRING PROVIDER: Johnnye,  Richard, MD   REFERRING DIAG: N81.6 (ICD-10-CM) - Rectocele  THERAPY DIAG:  Abnormal posture  Muscle weakness (generalized)  Other muscle spasm  Other low back pain  Rationale for Evaluation and Treatment: Rehabilitation  ONSET DATE: 8-10 months ago  SUBJECTIVE:                                                                                                                                                                                           SUBJECTIVE STATEMENT: Pt states that in the last 2 weeks she has had 0 bowel movements.   PAIN:  03/01/24 Are you having pain? No NPRS scale: 0/10 Pain location: low back pain (Rt>Lt)  Pain type: tight Pain description: intermittent   Aggravating factors: waking up/getting out of bed Relieving factors: sitting  PRECAUTIONS: Other: history of cancer (uterine)  RED FLAGS: None   WEIGHT BEARING RESTRICTIONS: No  FALLS:  Has patient fallen in last 6 months? No  OCCUPATION: not working   ACTIVITY LEVEL : no  PLOF: Independent  PATIENT GOALS: strengthen pelvic floor muscles  PERTINENT HISTORY:  Cholecystectomy, gastroplasty with duidenal switch 2020, laparoscopic hysterectomy/salpingectomy 2024, fibromyalgia, depression, diabetes mellitus, anixiety, bipolar 1, obesity, PTSD, adenocarcinoma, endometrial cancer,    BOWEL MOVEMENT: Pain with bowel movement: Yes - can be, but Linzess has really helped  Type of bowel movement:Type (Bristol Stool Scale) 2 or 4, Frequency 1x/day, Strain not usually, and Splinting yes, does use squatty potty Fully empty rectum: Yes: - Leakage: Yes: 2 accidents since starting linzess Pads: No Fiber supplement/laxative Yes linzess  URINATION: Pain with urination: No Fully empty bladder: Yes:   Stream: Strong Urgency: No Frequency: 3-4x/day; rarely wakes at night Fluid Intake:  between 4-64 oz a day Leakage: none Pads: No  INTERCOURSE:  Only had intercourse 1x since hysterectomy and it was painful  PREGNANCY: No children  PROLAPSE: None   OBJECTIVE:  Note: Objective measures were completed at Evaluation unless otherwise noted.   PATIENT SURVEYS:   PFIQ-7: 29  COGNITION: Overall cognitive status: Within functional limits for tasks assessed     SENSATION: Light touch: Appears intact   FUNCTIONAL TESTS:  Squat: Lyt weight shift, bil valgus knee collapse Single leg stance:  Rt: pelvic drop  Lt: pelvic drop Curl-up test: abdominal distortion   GAIT: Assistive device utilized: None Comments: WNL  POSTURE: rounded  shoulders, forward head, increased thoracic kyphosis, decreased thoracic kyphosis, posterior pelvic tilt, and elevated Rt shoulder, possible Rt thoracic curvature   LUMBARAROM/PROM:  A/PROM A/PROM  Eval (% available)  Flexion 50  Extension 50  Right lateral flexion 75  Left lateral flexion 75  Right rotation 50  Left rotation 50   (Blank rows = not tested)  PALPATION:   General: tightness throughout lumbar paraspinals   Pelvic Alignment: WNL, posterior pelvic tilt  Abdominal: decreased rib mobility, apical breathing pattern                 External Perineal Exam: some redness and irritation, several nodules on Lt labia minora                             Internal Pelvic Floor: WNL  Patient confirms identification and approves PT to assess internal pelvic floor and treatment Yes  PELVIC MMT:   MMT eval  Vaginal 2/5, 4 second hold, 10 repeat contractions  Internal Anal Sphincter 1/5  External Anal Sphincter 1/5  Puborectalis 1/5  Diastasis Recti 2 finger width separation   (Blank rows = not tested)        TONE: low  PROLAPSE: Unable to detect in supine, morning  TODAY'S TREATMENT:                                                                                                                              DATE:  03/01/24 Manual: Supine: Ileocecal valve release 10x in supine ILU bowel mobilization  in supine Midline upper abdominal scar tissue restriction  Neuromuscular re-education: Bridge with hip adduction, transversus abdominus, and pelvic floor muscle 2 x 10 Supine hip abduction blue band + bridge + transversus abdominus + exhale 2 x 10 Bridge march 2 x 10 Exercises: Prone windshield wipers 2 x 10 Prone hamstring curls 2 x 10 bil 4 lb ankle weights Single knee to chest 3x bil Supine piriformis stretch 60 sec bil  02/23/24: Butterfly stretch 3x30s > unilateral butterfly 2x30s each Single knee to chest 3x30s 2x10 opposite hand/knee ball press hooklying   X10 blue band hip abduction and marching in hooklying  Bridges 2x10 with hip adductor squeeze Quad fire hydrants x10 each  02/14/24 Manual: Ileocecal valve release 10x in supine ILU bowel mobilization  in supine Midline upper abdominal scar tissue restriction  Neuromuscular re-education: Bridge with hip adduction, transversus abdominus, and pelvic floor muscle 2 x 10 Unilateral side lying UE ball press and hip abduction with transversus abdominus and pelvic floor muscle contractions and breath coordination 10x Supine leg extensions 10x  Exercises: Single knee to chest 3x bil Kneeling hip flexor stretch 60 sec bil Supine piriformis stretch 60 sec bil Therapeutic activities: Fiber education Posterior lean with bowel movement Exhale with push during bowel movement   PATIENT EDUCATION:  Education details: Actor Person educated: Patient Education method: Programmer, multimedia, Facilities manager, Actor cues, Verbal cues, and Handouts Education comprehension: verbalized understanding  HOME EXERCISE PROGRAM: [id number]  ASSESSMENT:  CLINICAL IMPRESSION: Patient is a 48 y.o. female who was seen today for physical therapy treatment for rectocele and pelvic floor muscle weakness. She is doing very overall and has not had any  pain with bowel movements in several weeks. She was able to perform several exercise progressions to help strengthen abdominals and improve abdominal mobility with good tolerance. We will plan to progress to more sitting and standing activities. She will continue to benefit from skilled PT intervention in order to improve ease of bowel movements, address impairments, correct pressure management, and progress functional strengthening program.  OBJECTIVE IMPAIRMENTS: decreased activity tolerance, decreased coordination, decreased endurance, decreased mobility, decreased ROM, decreased strength, increased fascial restrictions, increased muscle spasms, impaired flexibility, impaired  tone, improper body mechanics, postural dysfunction, and pain.   ACTIVITY LIMITATIONS: bowel movements  PARTICIPATION LIMITATIONS: toileting  PERSONAL FACTORS: 3+ comorbidities: medical history are also affecting patient's functional outcome.   REHAB POTENTIAL: Good  CLINICAL DECISION MAKING: Evolving/moderate complexity  EVALUATION COMPLEXITY: Moderate   GOALS: Goals reviewed with patient? Yes  SHORT TERM GOALS: Updated 02/14/24    Pt will be independent with HEP.   Baseline: Goal status: MET 02/14/24  2.  Pt increase external anal sphincter strength to 2/5 in order to decrease fecal incontinence to decrease risk of infection.  Baseline:  Goal status: IN PROGRESS 02/14/24  3.  Pt will increase lumbar A/ROM by 25% in order to allow for improved pelvic floor muscle strength to improve complete emptying of stool.  Baseline:  Goal status: IN PROGRESS 02/14/24  4.  Pt will be independent with use of squatty potty, relaxed toileting mechanics, and improved bowel movement techniques in order to increase ease of bowel movements and complete evacuation.   Baseline:  Goal status: IN PROGRESS 02/14/24    LONG TERM GOALS: Updated 02/14/24  Pt will be independent with advanced HEP.   Baseline:  Goal status: IN PROGRESS 02/14/24  2.  Pt will increase external anal sphincter strength to at least 3/5 in order to decrease fecal incontinence to decrease risk of infection.  Baseline:  Goal status: IN PROGRESS 02/14/24  3.  Pt will report no episodes of fecal incontinence in order to prevent unwanted odors and skin irritation.  Baseline:  Goal status: IN PROGRESS 02/14/24  4.  Pt will report daily bowel movement with complete evacuation and no straining in order to improve low back pain.  Baseline:  Goal status: IN PROGRESS 02/14/24  5.  Pt will decrease PFIQ-7 score to less than 10 in order to demonstrate improved quality of life.  Baseline:  Goal status: IN PROGRESS 02/14/24  6.   Pt will be able to perform squat without bil valgus knee collapse in order to demonstrate functional strength that will decrease risk for falls.  Baseline:  Goal status: IN PROGRESS 02/14/24  PLAN:  PT FREQUENCY: 1-2x/week  PT DURATION: 12 weeks  PLANNED INTERVENTIONS: 97110-Therapeutic exercises, 97530- Therapeutic activity, 97112- Neuromuscular re-education, 97535- Self Care, [id number]- Manual therapy, Dry Needling, and Biofeedback  PLAN FOR NEXT SESSION: progress sitting and standing activities; possible D/C? Pt encouraged to think about her goals for being in PT and if she has met them  Josette Mares, PT, DPT08/06/251:13 PM

## 2024-03-08 ENCOUNTER — Ambulatory Visit: Admitting: Physical Therapy

## 2024-03-08 DIAGNOSIS — R293 Abnormal posture: Secondary | ICD-10-CM | POA: Diagnosis not present

## 2024-03-08 DIAGNOSIS — M6281 Muscle weakness (generalized): Secondary | ICD-10-CM

## 2024-03-08 DIAGNOSIS — M62838 Other muscle spasm: Secondary | ICD-10-CM

## 2024-03-08 NOTE — Therapy (Signed)
 OUTPATIENT PHYSICAL THERAPY FEMALE PELVIC TREATMENT   Patient Name: Megan Haas MRN: [id number] DOB:Apr 06, 1976, 48 y.o., female Today's Date: 03/08/2024  END OF SESSION:  PT End of Session - 03/08/24 1234     Visit Number 7    Date for PT Re-Evaluation 04/05/24    Authorization Type Medicare Kx at 15    Progress Note Due on Visit 11    PT Start Time 1231    PT Stop Time 1310    PT Time Calculation (min) 39 min    Activity Tolerance Patient tolerated treatment well    Behavior During Therapy 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   Anxiety    Arthritis    back   Bipolar 1 disorder (HCC)    Follows w/ Dr. Zelda Aurora @ [facility] for biopolar, depression, and anxiety.   COVID-19 07/2020   Depression    Diabetes mellitus without complication (HCC)    Type 2. Pt is taking Ozempic & follows w/ [facility].   Fibromyalgia    Follows w/ PCP, Suzen Downy, NP @ [facility].   History of sleep study    Pt stated she had a sleep study done in 2023 and was told she does not have sleep apnea.   Obesity, Class III, BMI 40-49.9 (morbid obesity)    Follows with [facility].   PTSD (post-traumatic stress disorder)    hx of   Wears glasses    for distance   Past Surgical History:  Procedure Laterality Date   CERVICAL CONIZATION W/BX N/A 08/27/2022   Procedure: CERVIAL BIOPSIES AND ENDOCERVICAL CURRETTAGE;  Surgeon: Viktoria Comer SAUNDERS, MD;  Location: [facility];  Service: Gynecology;  Laterality: N/A;   CHOLECYSTECTOMY  2006   COLONOSCOPY W/ POLYPECTOMY  03/26/2021   multiple colon polyps   DILATATION & CURETTAGE/HYSTEROSCOPY WITH MYOSURE N/A 08/27/2022   Procedure: DILATATION & CURETTAGE/HYSTEROSCOPY WITH MYOSURE;  Surgeon: Viktoria Comer SAUNDERS, MD;  Location:  [facility];  Service: Gynecology;  Laterality: N/A;   ESOPHAGOGASTRODUODENOSCOPY  08/18/2018   GASTROPLASTY DUODENAL SWITCH  01/2019   robotically   ROBOTIC ASSISTED LAPAROSCOPIC HYSTERECTOMY AND SALPINGECTOMY Bilateral 09/10/2022   Procedure: XI ROBOTIC ASSISTED LAPAROSCOPIC HYSTERECTOMY AND BILATERAL SALPINGO OOPHORECTOMY;  Surgeon: Viktoria Comer SAUNDERS, MD;  Location: [facility];  Service: Gynecology;  Laterality: Bilateral;   SENTINEL NODE BIOPSY N/A 09/10/2022   Procedure: SENTINEL NODE BIOPSY;  Surgeon: Viktoria Comer SAUNDERS, MD;  Location: [facility];  Service: Gynecology;  Laterality: N/A;   Patient Active Problem List   Diagnosis Date Noted   Genetic testing 11/06/2022   Endometrial cancer (HCC) 09/10/2022   Anxiety 09/04/2022   Adenocarcinoma (HCC) 08/14/2022   Abnormal uterine bleeding 08/14/2022   Obesity, Class III, BMI 40-49.9 (morbid obesity) 08/14/2022   Polyp of colon 05/21/2022   Iron deficiency 12/17/2021   S/P biliopancreatic diversion with duodenal switch 03/06/2019   Bipolar disorder (HCC) 07/11/2018   Morbid obesity (HCC) 07/11/2018   Type 2 diabetes mellitus (HCC) 06/30/2018   Hypersomnia with long sleep time, idiopathic 04/21/2018   Fatigue due to depression 04/21/2018   Snoring 04/21/2018   Post traumatic stress disorder (PTSD) 04/21/2018    PCP: Cristopher Suzen HERO, NP  REFERRING PROVIDER: Johnnye,  Richard, MD   REFERRING DIAG: N81.6 (ICD-10-CM) - Rectocele  THERAPY DIAG:  Abnormal posture  Muscle weakness (generalized)  Other muscle spasm  Rationale for Evaluation and Treatment: Rehabilitation  ONSET DATE: 8-10 months ago  SUBJECTIVE:                                                                                                                                                                                           SUBJECTIVE STATEMENT: Now able to have bowel movements 1x daily after taking linzess usually type 6-7, though does fully  evacuate and no straining now.  Hasn't started taking fiber yet per pt.  PAIN:  Are you having pain? No NPRS scale: 0/10 Pain location: low back pain (Rt>Lt)  Pain type: tight Pain description: intermittent   Aggravating factors: waking up/getting out of bed Relieving factors: sitting  PRECAUTIONS: Other: history of cancer (uterine)  RED FLAGS: None   WEIGHT BEARING RESTRICTIONS: No  FALLS:  Has patient fallen in last 6 months? No  OCCUPATION: not working   ACTIVITY LEVEL : no  PLOF: Independent  PATIENT GOALS: strengthen pelvic floor muscles  PERTINENT HISTORY:  Cholecystectomy, gastroplasty with duidenal switch 2020, laparoscopic hysterectomy/salpingectomy 2024, fibromyalgia, depression, diabetes mellitus, anixiety, bipolar 1, obesity, PTSD, adenocarcinoma, endometrial cancer,    BOWEL MOVEMENT: Pain with bowel movement: Yes - can be, but Linzess has really helped  Type of bowel movement:Type (Bristol Stool Scale) 2 or 4, Frequency 1x/day, Strain not usually, and Splinting yes, does use squatty potty Fully empty rectum: Yes: - Leakage: Yes: 2 accidents since starting linzess Pads: No Fiber supplement/laxative Yes linzess  URINATION: Pain with urination: No Fully empty bladder: Yes:   Stream: Strong Urgency: No Frequency: 3-4x/day; rarely wakes at night Fluid Intake:  between 4-64 oz a day Leakage: none Pads: No  INTERCOURSE:  Only had intercourse 1x since hysterectomy and it was painful  PREGNANCY: No children  PROLAPSE: None   OBJECTIVE:  Note: Objective measures were completed at Evaluation unless otherwise noted.   PATIENT SURVEYS:   PFIQ-7: 29  COGNITION: Overall cognitive status: Within functional limits for tasks assessed     SENSATION: Light touch: Appears intact   FUNCTIONAL TESTS:  Squat: Lyt weight shift, bil valgus knee collapse Single leg stance:  Rt: pelvic drop  Lt: pelvic drop Curl-up test: abdominal  distortion   GAIT: Assistive device utilized: None Comments: WNL  POSTURE: rounded shoulders, forward head, increased thoracic kyphosis, decreased thoracic kyphosis, posterior pelvic tilt, and elevated Rt shoulder, possible Rt thoracic curvature   LUMBARAROM/PROM:  A/PROM A/PROM  Eval (% available)  Flexion 50  Extension 50  Right lateral flexion 75  Left lateral flexion 75  Right rotation 50  Left rotation 50   (Blank rows = not tested)  PALPATION:   General: tightness throughout lumbar paraspinals   Pelvic Alignment: WNL, posterior pelvic tilt  Abdominal: decreased rib mobility, apical breathing pattern                 External Perineal Exam: some redness and irritation, several nodules on Lt labia minora                             Internal Pelvic Floor: WNL  Patient confirms identification and approves PT to assess internal pelvic floor and treatment Yes  PELVIC MMT:   MMT eval  Vaginal 2/5, 4 second hold, 10 repeat contractions  Internal Anal Sphincter 1/5  External Anal Sphincter 1/5  Puborectalis 1/5  Diastasis Recti 2 finger width separation   (Blank rows = not tested)        TONE: low  PROLAPSE: Unable to detect in supine, morning  TODAY'S TREATMENT:                                                                                                                              DATE:   03/08/24: Supine: Ileocecal valve release 10x in supine ILU bowel mobilization  in supine Midline upper abdominal scar tissue restriction  PFIQ-7 : 5 Reviewed rectocele anatomy Reviewed HEP and all goals, DC planning   03/01/24 Manual: Supine: Ileocecal valve release 10x in supine ILU bowel mobilization  in supine Midline upper abdominal scar tissue restriction  Neuromuscular re-education: Bridge with hip adduction, transversus abdominus, and pelvic floor muscle 2 x 10 Supine hip abduction blue band + bridge + transversus abdominus + exhale 2 x 10 Bridge march  2 x 10 Exercises: Prone windshield wipers 2 x 10 Prone hamstring curls 2 x 10 bil 4 lb ankle weights Single knee to chest 3x bil Supine piriformis stretch 60 sec bil  02/23/24: Butterfly stretch 3x30s > unilateral butterfly 2x30s each Single knee to chest 3x30s 2x10 opposite hand/knee ball press hooklying  X10 blue band hip abduction and marching in hooklying  Bridges 2x10 with hip adductor squeeze Quad fire hydrants x10 each  02/14/24 Manual: Ileocecal valve release 10x in supine ILU bowel mobilization  in supine Midline upper abdominal scar tissue restriction  Neuromuscular re-education: Bridge with hip adduction, transversus abdominus, and pelvic floor muscle 2 x 10 Unilateral side lying UE ball press and hip abduction with transversus abdominus and pelvic floor muscle contractions and breath coordination 10x Supine leg extensions 10x  Exercises: Single knee to chest 3x bil Kneeling hip flexor stretch 60 sec bil Supine piriformis stretch 60 sec bil Therapeutic activities: Fiber education Posterior lean with bowel movement Exhale with push during bowel movement   PATIENT EDUCATION:  Education details: [id number] Person educated: Patient Education method: Explanation, Demonstration, Tactile cues, Verbal cues,  and Handouts Education comprehension: verbalized understanding  HOME EXERCISE PROGRAM: [id number]  ASSESSMENT:  CLINICAL IMPRESSION: Patient is a 48 y.o. female who was seen today for physical therapy treatment for rectocele and pelvic floor muscle weakness. Pt has done well with therapy, no longer has symptoms related to referral, reports she feels confident and comfortable with DC today, all HEP and recommendations reviewed, pt denied questions and understands she will need a new referral for future PT.  OBJECTIVE IMPAIRMENTS: decreased activity tolerance, decreased coordination, decreased endurance, decreased mobility, decreased ROM, decreased strength, increased  fascial restrictions, increased muscle spasms, impaired flexibility, impaired tone, improper body mechanics, postural dysfunction, and pain.   ACTIVITY LIMITATIONS: bowel movements  PARTICIPATION LIMITATIONS: toileting  PERSONAL FACTORS: 3+ comorbidities: medical history are also affecting patient's functional outcome.   REHAB POTENTIAL: Good  CLINICAL DECISION MAKING: Evolving/moderate complexity  EVALUATION COMPLEXITY: Moderate   GOALS: Goals reviewed with patient? Yes  SHORT TERM GOALS: Updated 02/14/24    Pt will be independent with HEP.   Baseline: Goal status: MET 02/14/24  2.  Pt increase external anal sphincter strength to 2/5 in order to decrease fecal incontinence to decrease risk of infection.  Baseline:  Goal status: not met - pt deferred and not retested due to symptoms being better.   3.  Pt will increase lumbar A/ROM by 25% in order to allow for improved pelvic floor muscle strength to improve complete emptying of stool.  Baseline:  Goal status:MET 03/08/24  4.  Pt will be independent with use of squatty potty, relaxed toileting mechanics, and improved bowel movement techniques in order to increase ease of bowel movements and complete evacuation.   Baseline:  Goal status: MET 03/08/24    LONG TERM GOALS: Updated 02/14/24  Pt will be independent with advanced HEP.   Baseline:  Goal status: MET 03/08/24  2.  Pt will increase external anal sphincter strength to at least 3/5 in order to decrease fecal incontinence to decrease risk of infection.  Baseline:  Goal status:  not met - pt deferred and not retested due to symptoms being better.  3.  Pt will report no episodes of fecal incontinence in order to prevent unwanted odors and skin irritation.  Baseline:  Goal status: MET 03/08/24  4.  Pt will report daily bowel movement with complete evacuation and no straining in order to improve low back pain.  Baseline:  Goal status: MET 03/08/24  5.  Pt will  decrease PFIQ-7 score to less than 10 in order to demonstrate improved quality of life.  Baseline:  Goal status: MET 03/08/24 (5)  6.  Pt will be able to perform squat without bil valgus knee collapse in order to demonstrate functional strength that will decrease risk for falls.  Baseline:  Goal status: MET 03/08/24  PLAN:  PT FREQUENCY: 1-2x/week  PT DURATION: 12 weeks  PLANNED INTERVENTIONS: 97110-Therapeutic exercises, 97530- Therapeutic activity, 97112- Neuromuscular re-education, 97535- Self Care, [id number]- Manual therapy, Dry Needling, and Biofeedback  PLAN FOR NEXT SESSION:    PHYSICAL THERAPY DISCHARGE SUMMARY  Visits from Start of Care: 7  Current functional level related to goals / functional outcomes: All goals met except internal not retested at pt request   Remaining deficits: None per pt Education / Equipment: HEP   Patient agrees to discharge. Patient goals were met. Patient is being discharged due to being pleased with the current functional level.    Darryle Navy, PT, DPT 08/13/252:11 PM  [facility] [address]  [address], [address] [place], [place] [address] Phone # [phone] Fax [phone]

## 2024-04-19 ENCOUNTER — Telehealth: Payer: Self-pay | Admitting: *Deleted

## 2024-04-19 NOTE — Telephone Encounter (Signed)
 Spoke with Ms. Megan Haas who called the office to schedule her September follow up with Dr. Viktoria. Advised patient that Dr.Tucker doesn't have any openings until February and offered her an appointment in October with Dr. Rogelio. Pt declined and prefers an appt. With Dr. Viktoria. Patient scheduled for Friday, 09/01/2024 at 1:45. Pt agreed to date and time and has no further concerns at this time.

## 2024-09-01 ENCOUNTER — Encounter: Payer: Self-pay | Admitting: Gynecologic Oncology

## 2024-09-01 ENCOUNTER — Inpatient Hospital Stay: Admitting: Gynecologic Oncology

## 2024-09-01 VITALS — BP 119/73 | HR 70 | Temp 98.4°F | Resp 18 | Wt 233.0 lb

## 2024-09-01 DIAGNOSIS — N939 Abnormal uterine and vaginal bleeding, unspecified: Secondary | ICD-10-CM

## 2024-09-01 DIAGNOSIS — C541 Malignant neoplasm of endometrium: Secondary | ICD-10-CM

## 2024-09-01 NOTE — Progress Notes (Signed)
 Gynecologic Oncology Return Clinic Visit  09/01/24  Reason for Visit: surveillance  Treatment History: Oncology History Overview Note  P53 WT, MMR intact   Endometrial cancer (HCC)  09/10/2022 Initial Diagnosis   Endometrial cancer (HCC)    The patient endorses a longstanding history of irregular menses.  She saw her OBGYN in 07/2022. At the time of that visit, 2 or 3 larger cervical polyps were seen on exam, partially evulsed. Cervical polyp biopsy: 07/30/2022 reveals well-differentiated invasive adenocarcinoma.  IHC positive for p16, ER and PR.  Negative for p53.  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   08/27/22: Hysteroscopy with endometrial curettings, cervical biopsies, endocervical curettage.  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  This is seen in both the prolapsing endometrial polyp as well as the endometrial curettings.  Cervical biopsies show benign endocervical mucosa, no dysplasia or malignancy.  Endocervical curettage also negative for dysplasia or malignancy.   08/27/22: PET 1. Increased radiotracer uptake localizing to the endometrium compatible with known endometrial adenocarcinoma. 2. No signs of tracer avid retroperitoneal or pelvic lymph nodes. 3. 1.6 cm FDG avid central mesenteric lymph node is identified has an SUV max of 6.38. This is of uncertain clinical significance, and in the absence of retroperitoneal or pelvic adenopathy this would be an unusual location for nodal metastasis. Close interval follow-up is advised. 4. There is a borderline enlarged right inguinal lymph node with mild tracer uptake. This is nonspecific and may be reactive. 5. Two small nonspecific pulmonary nodules are identified measuring up to 5 mm. These are technically too small to characterize by PET-CT. Attention in these nodules on future  surveillance imaging is recommended. 6. Hepatic steatosis.   09/10/22: TRH/BSO, bilateral SLN biopsy, LOA. Stage IA1 low-grade endometrioid. No MI. NO LVI. P53 WT.   Interval History: Patient reports overall doing well.  Denies any pelvic pain or vaginal bleeding.  Was diagnosed with a rectocele since I saw her last and was struggling with constipation.  These issues have improved with Linzess and pelvic physical therapy.  Past Medical/Surgical History: Past Medical History:  Diagnosis Date   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   Anxiety    Arthritis    back   Bipolar 1 disorder (HCC)    Follows w/ Dr. Zelda Aurora @ [facility] for biopolar, depression, and anxiety.   COVID-19 07/2020   Depression    Diabetes mellitus without complication (HCC)    Type 2. Pt is taking Ozempic & follows w/ [facility].   Fibromyalgia    Follows w/ PCP, Suzen Downy, NP @ [facility].   History of sleep study    Pt stated she had a sleep study done in 2023 and was told she does not have sleep apnea.   Obesity, Class III, BMI 40-49.9 (morbid obesity) (HCC)    Follows with [facility].   PTSD (post-traumatic stress disorder)    hx of   Wears glasses    for distance    Past Surgical History:  Procedure Laterality Date   CERVICAL CONIZATION W/BX N/A 08/27/2022   Procedure: CERVIAL BIOPSIES AND ENDOCERVICAL CURRETTAGE;  Surgeon: Viktoria Comer SAUNDERS, MD;  Location: [facility];  Service: Gynecology;  Laterality: N/A;   CHOLECYSTECTOMY  2006  COLONOSCOPY W/ POLYPECTOMY  03/26/2021   multiple colon polyps   DILATATION & CURETTAGE/HYSTEROSCOPY WITH MYOSURE N/A 08/27/2022   Procedure: DILATATION & CURETTAGE/HYSTEROSCOPY WITH MYOSURE;  Surgeon: Viktoria Comer SAUNDERS, MD;  Location: [facility];  Service: Gynecology;  Laterality: N/A;    ESOPHAGOGASTRODUODENOSCOPY  08/18/2018   GASTROPLASTY DUODENAL SWITCH  01/2019   robotically   ROBOTIC ASSISTED LAPAROSCOPIC HYSTERECTOMY AND SALPINGECTOMY Bilateral 09/10/2022   Procedure: XI ROBOTIC ASSISTED LAPAROSCOPIC HYSTERECTOMY AND BILATERAL SALPINGO OOPHORECTOMY;  Surgeon: Viktoria Comer SAUNDERS, MD;  Location: [facility];  Service: Gynecology;  Laterality: Bilateral;   SENTINEL NODE BIOPSY N/A 09/10/2022   Procedure: SENTINEL NODE BIOPSY;  Surgeon: Viktoria Comer SAUNDERS, MD;  Location: [facility];  Service: Gynecology;  Laterality: N/A;    Family History  Problem Relation Age of Onset   Diabetes Father    Prostate cancer Father 21   Hypertension Maternal Aunt    Diabetes Maternal Uncle    Hypertension Maternal Grandmother    Skin cancer Maternal Grandmother    Diabetes Paternal Grandmother    Uterine cancer Other        maternal first cousin once removed   Uterine cancer Other        maternal first cousin once removed   Colon cancer Neg Hx    Stomach cancer Neg Hx    Pancreatic cancer Neg Hx    Esophageal cancer Neg Hx    Breast cancer Neg Hx    Ovarian cancer Neg Hx    Endometrial cancer Neg Hx     Social History   Socioeconomic History   Marital status: Married    Spouse name: Not on file   Number of children: Not on file   Years of education: Not on file   Highest education level: Not on file  Occupational History   Not on file  Tobacco Use   Smoking status: Former    Current packs/day: 0.00    Average packs/day: 0.8 packs/day for 10.0 years (7.5 ttl pk-yrs)    Types: Cigarettes    Start date: 23    Quit date: 2004    Years since quitting: 22.1   Smokeless tobacco: Never  Vaping Use   Vaping status: Never Used  Substance and Sexual Activity   Alcohol use: Yes    Alcohol/week: 1.0 standard drink of alcohol    Types: 1 Standard drinks or equivalent per week    Comment: occasional   Drug use: Not Currently    Comment: 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, pain killers,  marijuana, acid   Sexual activity: Yes    Birth control/protection: None  Other Topics Concern   Not on file  Social History Narrative   Not on file   Social Drivers of Health   Tobacco Use: Medium Risk (09/01/2024)   Patient History    Smoking Tobacco Use: Former    Smokeless Tobacco Use: Never    Passive Exposure: Not on Actuary Strain: Not on file  Food Insecurity: No Food Insecurity (08/31/2024)   Epic    Worried About Radiation Protection Practitioner of Food in the Last Year: Never true    Ran Out of Food in the Last Year: Never true  Transportation Needs: No Transportation Needs (08/31/2024)   Epic    Lack of Transportation (Medical): No    Lack of Transportation (Non-Medical): No  Physical Activity: Not on file  Stress: Not on file  Social Connections: Not on file  Depression (EYV7-0): Low Risk (  08/31/2024)   Depression (PHQ2-9)    PHQ-2 Score: 0  Alcohol Screen: Not on file  Housing: Unknown (08/31/2024)   Epic    Unable to Pay for Housing in the Last Year: No    Number of Times Moved in the Last Year: Not on file    Homeless in the Last Year: No  Utilities: Not At Risk (08/31/2024)   Epic    Threatened with loss of utilities: No  Health Literacy: Not on file    Current Medications: Current Medications[1]  Review of Systems: Denies appetite changes, fevers, chills, fatigue, unexplained weight changes. Denies hearing loss, neck lumps or masses, mouth sores, ringing in ears or voice changes. Denies cough or wheezing.  Denies shortness of breath. Denies chest pain or palpitations. Denies leg swelling. Denies abdominal distention, pain, blood in stools, constipation, diarrhea, nausea, vomiting, or early satiety. Denies pain with intercourse, dysuria, frequency, hematuria or incontinence. Denies hot flashes, pelvic pain, vaginal bleeding or vaginal discharge.   Denies joint pain, back pain or muscle pain/cramps. Denies itching, rash, or wounds. Denies dizziness, headaches,  numbness or seizures. Denies swollen lymph nodes or glands, denies easy bruising or bleeding. Denies anxiety, depression, confusion, or decreased concentration.  Physical Exam: BP 119/73 (BP Location: Left Arm, Patient Position: Sitting)   Pulse 70   Temp 98.4 F (36.9 C) (Oral)   Resp 18   Wt 233 lb (105.7 kg)   SpO2 100%   BMI 38.77 kg/m  General: Alert, oriented, no acute distress. HEENT: Normocephalic, atraumatic, sclera anicteric. Chest: Clear to auscultation bilaterally.  No wheezes or rhonchi. Cardiovascular: Regular rate and rhythm, no murmurs. Abdomen: Obese, soft, nontender.  Normoactive bowel sounds.  No masses or hepatosplenomegaly appreciated.  Well-healed incisions. Extremities: Grossly normal range of motion.  Warm, well perfused.  No edema bilaterally. Skin: No rashes or lesions noted. Lymphatics: No cervical, supraclavicular, or inguinal adenopathy. GU: Normal appearing external genitalia without erythema, excoriation, or lesions.  Speculum exam reveals cuff intact, no visible lesions, no blood or discharge within the vaginal vault.  Bimanual exam reveals cuff intact without nodularity or masses palpated.  Rectovaginal exam confirms these findings.  Laboratory & Radiologic Studies: None new  Assessment & Plan: Megan Haas is a 49 y.o. woman with Stage IA1 grade 1 endometrial cancer who presents for follow-up. Surgery in 08/2022. P53 WT, MMR intact. Negative germline genetic testing.   Patient is doing well and is NED on exam today.    Per NCCN surveillance recommendations, we will continue with visits every 6 months.  Given her low risk disease, we are alternating these visits between my office and her OB/GYN.  She will see her OB/GYN in 6 months and come back to see us  in 1 year.  We reviewed signs and symptoms that would be concerning for cancer recurrence, and I stressed the importance of calling if she develops any of these.  Discussed that we would get  imaging moving forward for new/concerning symptoms or findings on physical exam.   She continues to have very mild hot flashes, not very bothersome.  Discussed deferring starting any hormone replacement therapy at her last visit.  22 minutes of total time was spent for this patient encounter, including preparation, face-to-face counseling with the patient and coordination of care, and documentation of the encounter.  Comer Dollar, MD  Division of Gynecologic Oncology  Department of Obstetrics and Gynecology  [facility]      [1]  Current Outpatient  Medications:    acetaminophen  (TYLENOL ) 500 MG tablet, Take 1 tablet by mouth daily as needed., Disp: , Rfl:    amphetamine-dextroamphetamine (ADDERALL) 20 MG tablet, Take 20 mg by mouth 2 (two) times daily., Disp: , Rfl:    brexpiprazole (REXULTI) 2 MG TABS tablet, Take 2 mg by mouth in the morning., Disp: , Rfl:    buPROPion (WELLBUTRIN XL) 150 MG 24 hr tablet, Take 450 mg by mouth at bedtime., Disp: , Rfl:    Calcium Carb-Cholecalciferol (CALCIUM 500/D) 500-10 MG-MCG CHEW, Chew 1 tablet by mouth in the morning, at noon, in the evening, and at bedtime., Disp: , Rfl:    cloNIDine (CATAPRES) 0.2 MG tablet, Take by mouth., Disp: , Rfl:    folic acid (FOLVITE) 1 MG tablet, Take 1 mg by mouth., Disp: , Rfl:    gabapentin (NEURONTIN) 400 MG capsule, Take 400-800 mg by mouth See admin instructions. Take 1 capsule (400 mg) by mouth in the morning & take 2 capsules (800 mg) by mouth at bedtime., Disp: , Rfl:    ipratropium (ATROVENT) 0.03 % nasal spray, Place 2 sprays into both nostrils 2 (two) times daily as needed (allergies.)., Disp: , Rfl:    LINZESS 290 MCG CAPS capsule, Take 290 mcg by mouth daily., Disp: , Rfl:    MOUNJARO 15 MG/0.5ML Pen, SMARTSIG:0.5 Milliliter(s) SUB-Q Once a Week, Disp: , Rfl:    Multiple Vitamins-Minerals (MULTIVITAMIN WITH MINERALS) tablet, Take 1 tablet by mouth in the morning., Disp: , Rfl:     naltrexone (DEPADE) 50 MG tablet, Take by mouth daily., Disp: , Rfl:    VIIBRYD 40 MG TABS, Take 40 mg by mouth in the morning., Disp: , Rfl: 1

## 2024-09-01 NOTE — Patient Instructions (Addendum)
 It was good to see you today.  I do not see or feel any evidence of cancer recurrence on your exam.  Congratulations on your weight loss!  We will see you for follow-up in 12 months.  Please reach out to your OB/GYN for follow-up in 6 months.  As always, if you develop any new and concerning symptoms before your next visit, please call to see me sooner.

## 2025-08-28 ENCOUNTER — Inpatient Hospital Stay: Admitting: Gynecologic Oncology
